# Patient Record
Sex: Male | Born: 1945 | Race: White | Hispanic: No | Marital: Married | Smoking: Former smoker
Health system: Southern US, Community
[De-identification: ages and names within clinical notes are randomized; demographics above are authoritative.]

## PROBLEM LIST (undated history)

## (undated) DIAGNOSIS — M51369 Other intervertebral disc degeneration, lumbar region without mention of lumbar back pain or lower extremity pain: Secondary | ICD-10-CM

## (undated) DIAGNOSIS — M4810 Ankylosing hyperostosis [Forestier], site unspecified: Secondary | ICD-10-CM

## (undated) DIAGNOSIS — I2510 Atherosclerotic heart disease of native coronary artery without angina pectoris: Secondary | ICD-10-CM

## (undated) DIAGNOSIS — E785 Hyperlipidemia, unspecified: Secondary | ICD-10-CM

## (undated) DIAGNOSIS — M5136 Other intervertebral disc degeneration, lumbar region: Secondary | ICD-10-CM

## (undated) DIAGNOSIS — I219 Acute myocardial infarction, unspecified: Secondary | ICD-10-CM

## (undated) HISTORY — DX: Other intervertebral disc degeneration, lumbar region: M51.36

## (undated) HISTORY — DX: Other intervertebral disc degeneration, lumbar region without mention of lumbar back pain or lower extremity pain: M51.369

## (undated) HISTORY — DX: Acute myocardial infarction, unspecified: I21.9

## (undated) HISTORY — DX: Atherosclerotic heart disease of native coronary artery without angina pectoris: I25.10

## (undated) HISTORY — DX: Hyperlipidemia, unspecified: E78.5

---

## 1992-10-23 DIAGNOSIS — I219 Acute myocardial infarction, unspecified: Secondary | ICD-10-CM

## 1992-10-23 HISTORY — DX: Acute myocardial infarction, unspecified: I21.9

## 1992-10-23 HISTORY — PX: HERNIA REPAIR: SHX51

## 2000-10-23 HISTORY — PX: CORONARY ANGIOPLASTY WITH STENT PLACEMENT: SHX49

## 2011-02-28 ENCOUNTER — Ambulatory Visit (INDEPENDENT_AMBULATORY_CARE_PROVIDER_SITE_OTHER): Payer: Private Health Insurance - Indemnity | Admitting: Family Medicine

## 2011-02-28 ENCOUNTER — Encounter: Payer: Self-pay | Admitting: Family Medicine

## 2011-02-28 VITALS — BP 126/69 | HR 61 | Ht 69.0 in | Wt 173.0 lb

## 2011-02-28 DIAGNOSIS — M545 Low back pain: Secondary | ICD-10-CM

## 2011-02-28 MED ORDER — CYCLOBENZAPRINE HCL 10 MG PO TABS: 10.0000 mg | ORAL_TABLET | Freq: Every evening | ORAL | Status: AC | PRN

## 2011-02-28 MED ORDER — METHYLPREDNISOLONE (PAK) 4 MG PO TABS: 4.0000 mg | ORAL_TABLET | Freq: Every day | ORAL | Status: DC

## 2011-02-28 NOTE — Assessment & Plan Note (Signed)
Acute on chronic LBP with hx of disc herniation. Will treat with medrol dose pack and flexeril.  Gentle stretching, heat, ice, relative rest. Call if not improved in 7 days.

## 2011-02-28 NOTE — Progress Notes (Signed)
Subjective:    Patient ID: Jason Alvarado, male    DOB: 04/06/1946, 65 y.o.   MRN: [id number]  HPI 65 yo WM presents for a recurrence of LBP x 3 days.  He had an MRI of the L spine in June 2011 after the last flare up of back pain and told he had a herniated disc.  He had been doing home PT.  He had not had pain in between.  He has been doing E stim, ice and ibuprofen and vicodin from recent dental work. He son is a physical therapist.  Has pain with sitting and getting up.  He has pain over the middle of the lower back w/o radiation.  No change in bowel or bladder habits.  No paresthesias or weakness.  Denies pain in his legs, tingling or numbness.  BP 126/69  Pulse 61  Ht 5\' 9"  (1.753 m)  Wt 173 lb (78.472 kg)  BMI 25.55 kg/m2  SpO2 98%  Past Medical History  Diagnosis Date  . DDD (degenerative disc disease), lumbar     MRI 03-2010 in [place]  . CAD (coronary artery disease) 1994, 2002    angioplasty 1994, stented 2002  . Hyperlipidemia   . Acute MI 1994    Past Surgical History  Procedure Date  . Hernia repair 1994    repeat 1995, 2001    Family History  Problem Relation Age of Onset  . Heart disease Father   . Cancer Sister     History   Social History  . Marital Status: Married    Spouse Name: N/A    Number of Children: N/A  . Years of Education: N/A   Occupational History  . Not on file.   Social History Main Topics  . Smoking status: Former Smoker    Types: Cigarettes    Quit date: 10/23/1992  . Smokeless tobacco: Not on file  . Alcohol Use: 0.6 oz/week    1 Glasses of wine per week  . Drug Use: No  . Sexually Active: Yes    Birth Control/ Protection: Surgical   Other Topics Concern  . Not on file   Social History Narrative  . No narrative on file    Not on File  Current outpatient prescriptions:aspirin 81 MG tablet, Take 81 mg by mouth daily.  , Disp: , Rfl: ;  clopidogrel (PLAVIX) 75 MG tablet, Take 75 mg by mouth daily.  , Disp: , Rfl: ;   cyclobenzaprine (FLEXERIL) 10 MG tablet, Take 1 tablet (10 mg total) by mouth at bedtime as needed for muscle spasms., Disp: 20 tablet, Rfl: 1;  HYDROcodone-acetaminophen (VICODIN) 5-500 MG per tablet, , Disp: , Rfl:  methylPREDNIsolone (MEDROL DOSPACK) 4 MG tablet, Take 1 tablet (4 mg total) by mouth daily. follow package directions, Disp: 21 tablet, Rfl: 0;  Multiple Vitamin (MULTIVITAMIN PO), Take by mouth.  , Disp: , Rfl: ;  rosuvastatin (CRESTOR) 20 MG tablet, Take 20 mg by mouth daily.  , Disp: , Rfl:        Review of Systems  Constitutional: Negative for fatigue and unexpected weight change.  Respiratory: Negative for shortness of breath.   Cardiovascular: Negative for chest pain, palpitations and leg swelling.  Gastrointestinal: Negative for diarrhea and constipation.  Genitourinary: Negative for decreased urine volume and difficulty urinating.  Musculoskeletal: Positive for myalgias and back pain. Negative for arthralgias and gait problem.  Psychiatric/Behavioral: Positive for sleep disturbance. Negative for dysphoric mood.       Objective:  Physical Exam  Constitutional: He appears well-developed and well-nourished. No distress.  Eyes: Conjunctivae are normal.  Neck: Neck supple.  Cardiovascular: Normal rate, normal heart sounds and intact distal pulses.   No murmur heard. Pulmonary/Chest: Effort normal and breath sounds normal. No respiratory distress. He has no wheezes.  Abdominal: Soft. Bowel sounds are normal. He exhibits no distension.  Musculoskeletal: He exhibits no edema.       Lumbar back: He exhibits decreased range of motion (limited flex/ ext, + straight leg raise bilat), tenderness (tender at R>L L4-S1) and spasm (paraspinal fullness R side from T10- L5).  Neurological: He displays normal reflexes. He exhibits normal muscle tone.  Skin: Skin is warm and dry.  Psychiatric: He has a normal mood and affect.          Assessment & Plan:

## 2011-02-28 NOTE — Patient Instructions (Signed)
For back pain:  Take Medrol Dose pack as directed.  Avoid ibuprofen while you are on this.  OK to use Flexeril at bedtime (muscle relaxer).  Gentle stretching and avoidance of lifting, twisting will help.  Call if pain has not improved in 7-10 days.  Return for f/u visit with fasting labs in 2 mos.

## 2011-03-21 ENCOUNTER — Encounter: Payer: Self-pay | Admitting: Family Medicine

## 2011-03-21 DIAGNOSIS — E785 Hyperlipidemia, unspecified: Secondary | ICD-10-CM | POA: Insufficient documentation

## 2011-03-21 DIAGNOSIS — I10 Essential (primary) hypertension: Secondary | ICD-10-CM | POA: Insufficient documentation

## 2011-03-21 DIAGNOSIS — K5730 Diverticulosis of large intestine without perforation or abscess without bleeding: Secondary | ICD-10-CM | POA: Insufficient documentation

## 2011-03-21 DIAGNOSIS — K5900 Constipation, unspecified: Secondary | ICD-10-CM | POA: Insufficient documentation

## 2011-03-21 DIAGNOSIS — K219 Gastro-esophageal reflux disease without esophagitis: Secondary | ICD-10-CM | POA: Insufficient documentation

## 2011-05-15 ENCOUNTER — Ambulatory Visit (INDEPENDENT_AMBULATORY_CARE_PROVIDER_SITE_OTHER): Payer: Private Health Insurance - Indemnity | Admitting: Family Medicine

## 2011-05-15 ENCOUNTER — Encounter: Payer: Self-pay | Admitting: Family Medicine

## 2011-05-15 DIAGNOSIS — Z130 Encounter for screening for diseases of the blood and blood-forming organs and certain disorders involving the immune mechanism: Secondary | ICD-10-CM

## 2011-05-15 DIAGNOSIS — M545 Low back pain: Secondary | ICD-10-CM

## 2011-05-15 DIAGNOSIS — Z1329 Encounter for screening for other suspected endocrine disorder: Secondary | ICD-10-CM

## 2011-05-15 DIAGNOSIS — Z125 Encounter for screening for malignant neoplasm of prostate: Secondary | ICD-10-CM

## 2011-05-15 DIAGNOSIS — E785 Hyperlipidemia, unspecified: Secondary | ICD-10-CM

## 2011-05-15 DIAGNOSIS — I2510 Atherosclerotic heart disease of native coronary artery without angina pectoris: Secondary | ICD-10-CM | POA: Insufficient documentation

## 2011-05-15 LAB — CBC WITH DIFFERENTIAL/PLATELET
Basophils Absolute: 0 10*3/uL (ref 0.0–0.1)
Basophils Relative: 1 % (ref 0–1)
Hemoglobin: 13.4 g/dL (ref 13.0–17.0)
MCHC: 33.3 g/dL (ref 30.0–36.0)
Neutro Abs: 3.3 10*3/uL (ref 1.7–7.7)
Neutrophils Relative %: 64 % (ref 43–77)
RDW: 14.2 % (ref 11.5–15.5)
WBC: 5.1 10*3/uL (ref 4.0–10.5)

## 2011-05-15 LAB — LIPID PANEL
Total CHOL/HDL Ratio: 3.5 Ratio
VLDL: 26 mg/dL (ref 0–40)

## 2011-05-15 MED ORDER — ROSUVASTATIN CALCIUM 20 MG PO TABS: 20.0000 mg | ORAL_TABLET | Freq: Every day | ORAL | Status: DC

## 2011-05-15 MED ORDER — CLOPIDOGREL BISULFATE 75 MG PO TABS: 75.0000 mg | ORAL_TABLET | Freq: Every day | ORAL | Status: DC

## 2011-05-15 NOTE — Assessment & Plan Note (Signed)
RFd Crestor.  Update fasting labs today.

## 2011-05-15 NOTE — Assessment & Plan Note (Signed)
Resolved.  Off meds.  Doing stretching at home.

## 2011-05-15 NOTE — Patient Instructions (Addendum)
Update fasting labs today. Will call you w/ results tomorrow.  Plavix sent to [facility].  Crestor sent to [facility]. Will set you up to follow with Dr Deborah Chalk (cardiology) downstairs.  Return for a PHYSICAL in 6 mos.

## 2011-05-15 NOTE — Progress Notes (Signed)
  Subjective:    Patient ID: Jason Alvarado, male    DOB: [birth year], 65 y.o.   MRN: [id number]  HPI  65  yo WM presents for f/u visit.  He is due for fasting labs.  His back pain is much improved from his last visit in May.  Denies CP or DOE.  [place] cardiac [place]  Last seen by a cardiologist about a year and a half ago, on Plavix daily.  Due for RFs.  On Crestor daily.    BP 106/59  Pulse 59  Ht 5\' 9"  (1.753 m)  Wt 171 lb (77.565 kg)  BMI 25.25 kg/m2  SpO2 98%   Review of Systems  Respiratory: Negative for shortness of breath.   Cardiovascular: Negative for chest pain, palpitations and leg swelling.  Musculoskeletal: Negative for back pain.       Objective:   Physical Exam  Constitutional: He appears well-developed and well-nourished.  HENT:  Mouth/Throat: Oropharynx is clear and moist.  Neck:       No audible carotid bruits  Cardiovascular: Normal rate, regular rhythm and normal heart sounds.   Pulmonary/Chest: Effort normal and breath sounds normal. No respiratory distress. He has no wheezes.  Musculoskeletal: He exhibits no edema.          Assessment & Plan:

## 2011-05-15 NOTE — Assessment & Plan Note (Signed)
Will RF his Plavix and statin today, update labs and set him up to est care with Dr Jens Som.

## 2011-05-16 LAB — COMPLETE METABOLIC PANEL WITH GFR
ALT: 11 U/L (ref 0–53)
AST: 16 U/L (ref 0–37)
Albumin: 4.3 g/dL (ref 3.5–5.2)
Alkaline Phosphatase: 42 U/L (ref 39–117)
Potassium: 3.9 mEq/L (ref 3.5–5.3)
Sodium: 137 mEq/L (ref 135–145)
Total Protein: 6.4 g/dL (ref 6.0–8.3)

## 2011-05-16 LAB — PSA: PSA: 1.79 ng/mL (ref <=4.00)

## 2011-05-18 ENCOUNTER — Telehealth: Payer: Self-pay | Admitting: Family Medicine

## 2011-05-18 NOTE — Telephone Encounter (Signed)
Pls let pt know that his blood counts, cholesterol and screen for prostate cancer all look great.   THE LAB HAS YET TO RUN HIS CHEMISTRY PANEL, SO RESULTS ARE STILL PENDING.

## 2011-05-18 NOTE — Telephone Encounter (Signed)
Advised pt's wife of results and made copy for pt to pick up tomr.

## 2011-05-21 ENCOUNTER — Telehealth: Payer: Self-pay | Admitting: Family Medicine

## 2011-05-21 NOTE — Telephone Encounter (Signed)
Chemistry panel finally came back and his fasting sugar, liver and kidney function look great.

## 2011-05-22 NOTE — Telephone Encounter (Signed)
LMOM informing Pt  

## 2011-10-31 ENCOUNTER — Encounter: Payer: Self-pay | Admitting: Family Medicine

## 2011-10-31 ENCOUNTER — Ambulatory Visit (INDEPENDENT_AMBULATORY_CARE_PROVIDER_SITE_OTHER): Payer: Private Health Insurance - Indemnity | Admitting: Family Medicine

## 2011-10-31 VITALS — BP 115/62 | HR 70 | Ht 69.0 in | Wt 178.0 lb

## 2011-10-31 DIAGNOSIS — M545 Low back pain, unspecified: Secondary | ICD-10-CM

## 2011-10-31 DIAGNOSIS — Z0000 Encounter for general adult medical examination without abnormal findings: Secondary | ICD-10-CM

## 2011-10-31 DIAGNOSIS — R21 Rash and other nonspecific skin eruption: Secondary | ICD-10-CM

## 2011-10-31 DIAGNOSIS — I2510 Atherosclerotic heart disease of native coronary artery without angina pectoris: Secondary | ICD-10-CM

## 2011-10-31 DIAGNOSIS — Z23 Encounter for immunization: Secondary | ICD-10-CM

## 2011-10-31 LAB — PSA: PSA: 2.08 ng/mL (ref <=4.00)

## 2011-10-31 LAB — LIPID PANEL
HDL: 43 mg/dL (ref >39)
Total CHOL/HDL Ratio: 4.5 Ratio
Triglycerides: 227 mg/dL — ABNORMAL HIGH (ref <150)

## 2011-10-31 MED ORDER — HYOSCYAMINE SULFATE 0.125 MG SL SUBL: 0.1250 mg | SUBLINGUAL_TABLET | Freq: Four times a day (QID) | SUBLINGUAL | Status: AC | PRN

## 2011-10-31 NOTE — Patient Instructions (Signed)
Start a regular exercise program and make sure you are eating a healthy diet Try to eat 4 servings of dairy a day or take a calcium supplement (500mg  twice a day). Your vaccines are up to date.  Think about the singles vaccine.

## 2011-10-31 NOTE — Progress Notes (Signed)
Subjective:    Patient ID: Jason Alvarado, male    DOB: [birth year], 66 y.o.   MRN: [id number]  HPI He is here for physical today. He is not established with a cardiologist in the area since moving here from [place]. He does have a history of coronary artery disease with a stent times one. He does not take a beta blocker but he does take an aspirin and Plavix. He would like to be established more locally.  Chronic low back pain. His history of degenerative disc disease in his lumbar spine. He did have an MRI in 2011 in [place] that showed DISH.  He did complete physical therapy initially and says it helped him greatly. He has never seen an orthopedist for this. He is currently not taking any pain medications. It is worse with prolonged sitting. 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 He is also now experienced and upper back pain as well.  Rash on his right shin that has been there for couple months. Overall he says it does look better. Initially was very red and irritated and now it looks more dusky and purple. It is scaling. He has been putting a cream on it that his wife gave him. He is not exactly sure what is in it. He denies any itching, irritation, pain and drainage. He denies any initial trauma. No prior history of skin cancer.  Review of Systems Comprehensive ROS is o/w neg. No CP or SOB.   BP 115/62  Pulse 70  Ht 5\' 9"  (1.753 m)  Wt 178 lb (80.74 kg)  BMI 26.29 kg/m2    No Known Allergies  Past Medical History  Diagnosis Date  . DDD (degenerative disc disease), lumbar     MRI 03-2010 in [place]  . CAD (coronary artery disease) 1994, 2002    angioplasty 1994, stented x 1 2002  . Hyperlipidemia   . Acute MI 1994    Past Surgical History  Procedure Date  . Hernia repair 1994    repeat 1995, 2001 groin  . Coronary angioplasty with stent placement 2002    History   Social History  . Marital Status: Married    Spouse Name:  Angelique Blonder     Number of Children: 3   . Years of Education: N/A   Occupational History  . Engineer.     Social History Main Topics  . Smoking status: Former Smoker    Types: Cigarettes    Quit date: 10/23/1992  . Smokeless tobacco: Not on file  . Alcohol Use: 0.6 oz/week    1 Glasses of wine per week  . Drug Use: No  . Sexually Active: Yes    Birth Control/ Protection: Surgical   Other Topics Concern  . Not on file   Social History Narrative   2-3 cups per day coffee.  No regular exercise.     Family History  Problem Relation Age of Onset  . Heart disease Father   . Breast cancer Sister   . Rheum arthritis Mother     Current outpatient prescriptions:aspirin 81 MG tablet, Take 81 mg by mouth daily.  , Disp: , Rfl: ;  clopidogrel (PLAVIX) 75 MG tablet, Take 1 tablet (75 mg total) by mouth daily., Disp: 90 tablet, Rfl: 3;  Multiple Vitamin (MULTIVITAMIN PO), Take by mouth.  , Disp: , Rfl: ;  rosuvastatin (CRESTOR) 20 MG tablet, Take 1 tablet (20 mg total) by  mouth daily., Disp: 90 tablet, Rfl: 3 cyclobenzaprine (FLEXERIL) 10 MG tablet, Take 1 tablet (10 mg total) by mouth at bedtime as needed for muscle spasms., Disp: 20 tablet, Rfl: 1      Objective:   Physical Exam  Constitutional: He is oriented to person, place, and time. He appears well-developed and well-nourished.  HENT:  Head: Normocephalic and atraumatic.  Right Ear: External ear normal.  Left Ear: External ear normal.  Nose: Nose normal.  Mouth/Throat: Oropharynx is clear and moist.  Eyes: Conjunctivae and EOM are normal. Pupils are equal, round, and reactive to light.  Neck: Normal range of motion. Neck supple. No thyromegaly present.  Cardiovascular: Normal rate, regular rhythm, normal heart sounds and intact distal pulses.   Pulmonary/Chest: Effort normal and breath sounds normal.  Abdominal: Soft. Bowel sounds are normal. He exhibits no distension and no mass. There is no tenderness. There is no rebound and  no guarding.  Genitourinary: Rectum normal. Guaiac negative stool.       He does have a 2+ enlarged prostate, no bogginess, no nodules. He denies any urinary symptoms.  Musculoskeletal: Normal range of motion.       Normal flexion and extension of the spine. He is nontender over the lumbar or upper thoracic spine. He has very tight muscles around the upper thoracic spine. He also has a lot of spasm around the left paraspinous muscles. Negative straight-leg raise bilaterally. Hip, knee, ankle strength is 5 over 5 bilaterally.  Lymphadenopathy:    He has no cervical adenopathy.  Neurological: He is alert and oriented to person, place, and time. He has normal reflexes.  Skin: Skin is warm and dry.       On his right shin he has an approximately 1 x 3 cm oval-shaped purple macular papular lesion. He has some thick scaling across the top. I did scrape the lesion to send for KOH.  Psychiatric: He has a normal mood and affect. His behavior is normal. Judgment and thought content normal.          Assessment & Plan:  CPE - Start a regular exercise program and make sure you are eating a healthy diet Try to eat 4 servings of dairy a day or take a calcium supplement (500mg  twice a day). Your vaccines are up to date.   Pneumonia vaccine updated today. We did discuss shingles vaccines but unfortunately it is on back order right now. I asked him to check with his insurance company to see if it is covered.  Chronic low back and now upper back pain-based on his exam and his history I think he would greatly benefit from physical therapy. If he is not improving at that time then consider further evaluation by orthopedics.  Coronary artery disease-he is on a beta blocker but he is on aspirin and Plavix. His blood pressure looks fantastic today. There are some newer study showed there may not be long term benefit past one year the patient's with coronary artery disease/MI. I would like to get him established  here locally and we'll refer him to Dr. Olga Millers in a building.  Rash on the right shin-the area was scraped with the slides and will be sent for KOH for evaluation of fungal elements.

## 2011-11-01 LAB — COMPLETE METABOLIC PANEL WITH GFR
Alkaline Phosphatase: 52 U/L (ref 39–117)
GFR, Est Non African American: 85 mL/min
Glucose, Bld: 100 mg/dL — ABNORMAL HIGH (ref 70–99)
Sodium: 140 mEq/L (ref 135–145)
Total Bilirubin: 0.6 mg/dL (ref 0.3–1.2)
Total Protein: 6.7 g/dL (ref 6.0–8.3)

## 2011-11-08 ENCOUNTER — Ambulatory Visit (INDEPENDENT_AMBULATORY_CARE_PROVIDER_SITE_OTHER): Payer: Private Health Insurance - Indemnity | Admitting: Cardiology

## 2011-11-08 ENCOUNTER — Encounter: Payer: Self-pay | Admitting: Family Medicine

## 2011-11-08 ENCOUNTER — Ambulatory Visit: Payer: Private Health Insurance - Indemnity | Attending: Family Medicine | Admitting: Physical Therapy

## 2011-11-08 ENCOUNTER — Encounter: Payer: Self-pay | Admitting: Cardiology

## 2011-11-08 VITALS — BP 131/77 | HR 61 | Ht 69.0 in | Wt 179.0 lb

## 2011-11-08 DIAGNOSIS — I2510 Atherosclerotic heart disease of native coronary artery without angina pectoris: Secondary | ICD-10-CM

## 2011-11-08 DIAGNOSIS — M5136 Other intervertebral disc degeneration, lumbar region: Secondary | ICD-10-CM | POA: Insufficient documentation

## 2011-11-08 DIAGNOSIS — M2569 Stiffness of other specified joint, not elsewhere classified: Secondary | ICD-10-CM | POA: Insufficient documentation

## 2011-11-08 DIAGNOSIS — M545 Low back pain, unspecified: Secondary | ICD-10-CM | POA: Insufficient documentation

## 2011-11-08 DIAGNOSIS — IMO0001 Reserved for inherently not codable concepts without codable children: Secondary | ICD-10-CM | POA: Insufficient documentation

## 2011-11-08 DIAGNOSIS — E785 Hyperlipidemia, unspecified: Secondary | ICD-10-CM

## 2011-11-08 DIAGNOSIS — R079 Chest pain, unspecified: Secondary | ICD-10-CM | POA: Insufficient documentation

## 2011-11-08 MED ORDER — ROSUVASTATIN CALCIUM 40 MG PO TABS: 40.0000 mg | ORAL_TABLET | Freq: Every day | ORAL | Status: DC

## 2011-11-08 NOTE — Patient Instructions (Signed)
Your physician wants you to follow-up in: 6 months You will receive a reminder letter in the mail two months in advance. If you don't receive a letter, please call our office to schedule the follow-up appointment.   STOP PLAVIX  Your physician has requested that you have en exercise stress myoview. For further information please visit [email or web address] Please follow instruction sheet, as given.   INCREASE CRESTOR TO 40 MG ONCE DAILY  Your physician recommends that you return for lab work in: 6 WEEKS

## 2011-11-08 NOTE — Assessment & Plan Note (Signed)
Symptoms atypical. Schedule Myoview for risk stratification. 

## 2011-11-08 NOTE — Assessment & Plan Note (Signed)
Increase Crestor to 40 mg daily as recent LDL greater than 100. Check lipids and liver in 6 weeks.

## 2011-11-08 NOTE — Assessment & Plan Note (Signed)
Continue aspirin and statin. Discontinue Plavix. 

## 2011-11-08 NOTE — Progress Notes (Addendum)
HPI: 66 year old male for evaluation of coronary disease and chest pain. Patient suffered previous myocardial infarction and had PTCA in [place]. His last stent was placed in 2002 (RCA). He was then followed in [place] but now presents to establish here. Approximately one week ago the patient was working in his yard. 2 days later he developed a chest tightness for approximately one hour. The pain was not pleuritic, positional or related to food. No radiation and no associated symptoms. Unlike his previous infarct pain. He feels as though it may have been musculoskeletal. He otherwise denies dyspnea on exertion, orthopnea, PND, pedal edema or exertional chest pain.  Current Outpatient Prescriptions  Medication Sig Dispense Refill  . aspirin 81 MG tablet Take 81 mg by mouth daily.        . clopidogrel (PLAVIX) 75 MG tablet Take 1 tablet (75 mg total) by mouth daily.  90 tablet  3  . cyclobenzaprine (FLEXERIL) 10 MG tablet Take 1 tablet (10 mg total) by mouth at bedtime as needed for muscle spasms.  20 tablet  1  . hyoscyamine (LEVSIN SL) 0.125 MG SL tablet Place 1 tablet (0.125 mg total) under the tongue every 6 (six) hours as needed for cramping.  30 tablet  0  . Multiple Vitamin (MULTIVITAMIN PO) Take by mouth.        . rosuvastatin (CRESTOR) 20 MG tablet Take 1 tablet (20 mg total) by mouth daily.  90 tablet  3    No Known Allergies  Past Medical History  Diagnosis Date  . DDD (degenerative disc disease), lumbar     MRI 03-2010 in [place]  . CAD (coronary artery disease) 1994, 2002    angioplasty 1994, stented x 1 2002  . Hyperlipidemia   . Acute MI 1994    Past Surgical History  Procedure Date  . Hernia repair 1994    repeat 1995, 2001 groin  . Coronary angioplasty with stent placement 2002    History   Social History  . Marital Status: Married    Spouse Name: Angelique Blonder     Number of Children: 3   . Years of Education: N/A   Occupational History  . Engineer.     Social  History Main Topics  . Smoking status: Former Smoker    Types: Cigarettes    Quit date: 10/23/1992  . Smokeless tobacco: Not on file  . Alcohol Use: 0.6 oz/week    1 Glasses of wine per week     Occasionally  . Drug Use: No  . Sexually Active: Yes    Birth Control/ Protection: Surgical   Other Topics Concern  . Not on file   Social History Narrative   2-3 cups per day coffee.  No regular exercise.     Family History  Problem Relation Age of Onset  . Heart disease Father     MI at age 81  . Breast cancer Sister   . Rheum arthritis Mother     ROS: no fevers or chills, productive cough, hemoptysis, dysphasia, odynophagia, melena, hematochezia, dysuria, hematuria, rash, seizure activity, orthopnea, PND, pedal edema, claudication. Remaining systems are negative.  Physical Exam:  Blood pressure 131/77, pulse 61, height 5\' 9"  (1.753 m), weight 179 lb (81.194 kg).  General:  Well developed/well nourished in NAD Skin warm/dry Patient not depressed No peripheral clubbing Back-normal HEENT-normal/normal eyelids Neck supple/normal carotid upstroke bilaterally; no bruits; no JVD; no thyromegaly chest - CTA/ normal expansion CV - RRR/normal S1 and S2; no murmurs,  rubs or gallops;  PMI nondisplaced Abdomen -NT/ND, no HSM, no mass, + bowel sounds, no bruit 2+ femoral pulses, no bruits Ext-no edema, chords, 2+ DP Neuro-grossly nonfocal  ECG Sinus rhythm with no ST changes; CRO prior IMI

## 2011-11-14 ENCOUNTER — Ambulatory Visit (HOSPITAL_COMMUNITY): Payer: Private Health Insurance - Indemnity | Attending: Cardiology | Admitting: Radiology

## 2011-11-14 VITALS — BP 121/76 | Ht 69.0 in | Wt 176.0 lb

## 2011-11-14 DIAGNOSIS — R079 Chest pain, unspecified: Secondary | ICD-10-CM

## 2011-11-14 DIAGNOSIS — I2510 Atherosclerotic heart disease of native coronary artery without angina pectoris: Secondary | ICD-10-CM

## 2011-11-14 DIAGNOSIS — E785 Hyperlipidemia, unspecified: Secondary | ICD-10-CM

## 2011-11-14 DIAGNOSIS — Z8249 Family history of ischemic heart disease and other diseases of the circulatory system: Secondary | ICD-10-CM | POA: Insufficient documentation

## 2011-11-14 DIAGNOSIS — Z9861 Coronary angioplasty status: Secondary | ICD-10-CM | POA: Insufficient documentation

## 2011-11-14 DIAGNOSIS — R0789 Other chest pain: Secondary | ICD-10-CM | POA: Insufficient documentation

## 2011-11-14 DIAGNOSIS — Z87891 Personal history of nicotine dependence: Secondary | ICD-10-CM | POA: Insufficient documentation

## 2011-11-14 DIAGNOSIS — I10 Essential (primary) hypertension: Secondary | ICD-10-CM | POA: Insufficient documentation

## 2011-11-14 MED ORDER — TECHNETIUM TC 99M TETROFOSMIN IV KIT
33.0000 | PACK | Freq: Once | INTRAVENOUS | Status: AC | PRN
  Administered 2011-11-14: 33 via INTRAVENOUS

## 2011-11-14 MED ORDER — TECHNETIUM TC 99M TETROFOSMIN IV KIT
11.0000 | PACK | Freq: Once | INTRAVENOUS | Status: AC | PRN
  Administered 2011-11-14: 11 via INTRAVENOUS

## 2011-11-14 NOTE — Progress Notes (Signed)
[facility] SITE 3 NUCLEAR MED [address] [place] [address] [phone]  Cardiology Nuclear Med Study  Jason Alvarado is a 66 y.o. male [id number] [birth year]   Nuclear Med Background Indication for Stress Test:  Evaluation for Ischemia and PTCA/Stent Patency History:  Prior Cardiac Hx. Performed in [place]; '94 MI>PTCA; '02 Stent-RCA; [facility]:(no report)  Cardiac Risk Factors: Family History - CAD, History of Smoking, Hypertension and Lipids  Symptoms:  Chest Pain/Tightness with Exertion (last episode of chest discomfort about a week ago)   Nuclear Pre-Procedure Caffeine/Decaff Intake:  None NPO After: 7:00pm   Lungs:  Clear. IV 0.9% NS with Angio Cath:  20g  IV Site: R Antecubital x 1, tolerated well IV Started by:  Irean Hong, RN  Chest Size (in):  40 Cup Size: n/a  Height: 5\' 9"  (1.753 m)  Weight:  176 lb (79.833 kg)  BMI:  Body mass index is 25.99 kg/(m^2). Tech Comments:  n/a    Nuclear Med Study 1 or 2 day study: 1 day  Stress Test Type:  Stress  Reading MD: Willa Rough, MD  Order Authorizing Provider:  Olga Millers, MD  Resting Radionuclide: Technetium 59m Tetrofosmin  Resting Radionuclide Dose: 11.0 mCi   Stress Radionuclide:  Technetium 59m Tetrofosmin  Stress Radionuclide Dose: 33.0 mCi           Stress Protocol Rest HR: 62 Stress HR: 157  Rest BP: Sitting 121/76  Standing 140/73 Stress BP: 184/66  Exercise Time (min): 8:31 METS: 10.4   Predicted Max HR: 154 bpm % Max HR: 101.95 bpm Rate Pressure Product: 21308   Dose of Adenosine (mg):  n/a Dose of Lexiscan: n/a mg  Dose of Atropine (mg): n/a Dose of Dobutamine: n/a mcg/kg/min (at max HR)  Stress Test Technologist: Smiley Houseman, CMA-N  Nuclear Technologist:  Domenic Polite, CNMT     Rest Procedure:  Myocardial perfusion imaging was performed at rest 45 minutes following the intravenous administration of Technetium 44m Tetrofosmin.  Rest ECG: Nonspecific T-wave changes.  Stress  Procedure:  The patient exercised for 8:31 on the treadmill utilizing the Bruce protocol.  The patient stopped due to fatigue and denied any chest pain.  There were no diagnostic ST-T wave changes, isolated PVC.  Technetium 2m Tetrofosmin was injected at peak exercise and myocardial perfusion imaging was performed after a brief delay.  Stress ECG: No significant change from baseline ECG  QPS Raw Data Images:  Patient motion noted; appropriate software correction applied. Stress Images:  Moderate decresed activity at the base of the inferolateral wall. Rest Images:  Similar to stress. Subtraction (SDS):  Slight reversal at the base of the inferolateral wall. Transient Ischemic Dilatation (Normal <1.22):  1.04 Lung/Heart Ratio (Normal <0.45):  0.30  Quantitative Gated Spect Images QGS EDV:  107 ml QGS ESV:  50 ml QGS cine images:  Hypokinesis of the base of the inferior wall. QGS EF: 53%  Impression Exercise Capacity:  Good exercise capacity. BP Response:  Normal blood pressure response. Clinical Symptoms:  No chest pain. ECG Impression:  No significant ST segment change suggestive of ischemia. Comparison with Prior Nuclear Study: No images to compare  Overall Impression:  There is a small/moderate scar at the base of the inferior/inferolateral wall. There is mild peri-infarct ischemia in that area.  Willa Rough, MD

## 2011-11-15 ENCOUNTER — Ambulatory Visit: Payer: Private Health Insurance - Indemnity | Admitting: Physical Therapy

## 2011-12-05 ENCOUNTER — Ambulatory Visit: Payer: Private Health Insurance - Indemnity | Attending: Family Medicine | Admitting: Physical Therapy

## 2011-12-05 DIAGNOSIS — M2569 Stiffness of other specified joint, not elsewhere classified: Secondary | ICD-10-CM | POA: Insufficient documentation

## 2011-12-05 DIAGNOSIS — M545 Low back pain, unspecified: Secondary | ICD-10-CM | POA: Insufficient documentation

## 2011-12-05 DIAGNOSIS — IMO0001 Reserved for inherently not codable concepts without codable children: Secondary | ICD-10-CM | POA: Insufficient documentation

## 2011-12-07 ENCOUNTER — Ambulatory Visit: Payer: Private Health Insurance - Indemnity | Admitting: Physical Therapy

## 2011-12-12 ENCOUNTER — Ambulatory Visit: Payer: Private Health Insurance - Indemnity | Admitting: *Deleted

## 2011-12-14 ENCOUNTER — Ambulatory Visit: Payer: Private Health Insurance - Indemnity | Admitting: Physical Therapy

## 2011-12-19 ENCOUNTER — Ambulatory Visit: Payer: Private Health Insurance - Indemnity | Admitting: *Deleted

## 2011-12-21 ENCOUNTER — Ambulatory Visit: Payer: Private Health Insurance - Indemnity | Admitting: *Deleted

## 2011-12-26 ENCOUNTER — Ambulatory Visit: Payer: Private Health Insurance - Indemnity | Admitting: Physical Therapy

## 2011-12-26 ENCOUNTER — Ambulatory Visit: Payer: Private Health Insurance - Indemnity | Attending: Family Medicine | Admitting: Physical Therapy

## 2011-12-26 DIAGNOSIS — M545 Low back pain, unspecified: Secondary | ICD-10-CM | POA: Insufficient documentation

## 2011-12-26 DIAGNOSIS — M2569 Stiffness of other specified joint, not elsewhere classified: Secondary | ICD-10-CM | POA: Insufficient documentation

## 2011-12-26 DIAGNOSIS — IMO0001 Reserved for inherently not codable concepts without codable children: Secondary | ICD-10-CM | POA: Insufficient documentation

## 2011-12-28 ENCOUNTER — Ambulatory Visit: Payer: Private Health Insurance - Indemnity | Admitting: Physical Therapy

## 2012-01-02 ENCOUNTER — Ambulatory Visit: Payer: Private Health Insurance - Indemnity | Admitting: Physical Therapy

## 2012-10-03 ENCOUNTER — Emergency Department (INDEPENDENT_AMBULATORY_CARE_PROVIDER_SITE_OTHER)
Admission: EM | Admit: 2012-10-03 | Discharge: 2012-10-03 | Disposition: A | Discharge status: Home / Self Care | Payer: Private Health Insurance - Indemnity | Source: Home / Self Care | Attending: Family Medicine | Admitting: Family Medicine

## 2012-10-03 ENCOUNTER — Encounter: Payer: Self-pay | Admitting: *Deleted

## 2012-10-03 ENCOUNTER — Emergency Department (INDEPENDENT_AMBULATORY_CARE_PROVIDER_SITE_OTHER): Payer: Private Health Insurance - Indemnity

## 2012-10-03 DIAGNOSIS — M25569 Pain in unspecified knee: Secondary | ICD-10-CM

## 2012-10-03 DIAGNOSIS — M1710 Unilateral primary osteoarthritis, unspecified knee: Secondary | ICD-10-CM

## 2012-10-03 NOTE — ED Notes (Signed)
Pt c/o LT knee pain x 1wk. Pt reports having injections in that knee in 2008. He has taken IBF.

## 2012-10-03 NOTE — ED Provider Notes (Addendum)
History     CSN: [id number]  Arrival date & time 10/03/12  1616   First MD Initiated Contact with Patient 10/03/12 1619      Chief Complaint  Patient presents with  . Knee Pain   HPI  L knee pain x 1 week.  Baseline hx/o knee OA.  Has had hyaluronic acid injections in the past.  This was several years ago.  Has had flare of knee pain. No known injury.  Mild knee swelling, though improved.  Pain worst in medial compartment of knee.  Pain constant.  Worse with bending.   Past Medical History  Diagnosis Date  . DDD (degenerative disc disease), lumbar     MRI 03-2010 in [place]  . CAD (coronary artery disease) 1994, 2002    angioplasty 1994, stented x 1 2002  . Hyperlipidemia   . Acute MI 1994    Past Surgical History  Procedure Date  . Hernia repair 1994    repeat 1995, 2001 groin  . Coronary angioplasty with stent placement 2002    Family History  Problem Relation Age of Onset  . Heart disease Father     MI at age 79  . Breast cancer Sister   . Rheum arthritis Mother     History  Substance Use Topics  . Smoking status: Former Smoker    Types: Cigarettes    Quit date: 10/23/1992  . Smokeless tobacco: Not on file  . Alcohol Use: 0.6 oz/week    1 Glasses of wine per week     Comment: Occasionally      Review of Systems  All other systems reviewed and are negative.    Allergies  Review of patient's allergies indicates no known allergies.  Home Medications   Current Outpatient Rx  Name  Route  Sig  Dispense  Refill  . ASPIRIN 81 MG PO TABS   Oral   Take 81 mg by mouth daily.           . MULTIVITAMIN PO   Oral   Take by mouth.           . ROSUVASTATIN CALCIUM 40 MG PO TABS   Oral   Take 1 tablet (40 mg total) by mouth daily.   90 tablet   3     BP 124/70  Pulse 71  Temp 98.1 F (36.7 C) (Oral)  Resp 16  Ht 5\' 9"  (1.753 m)  Wt 178 lb (80.74 kg)  BMI 26.29 kg/m2  SpO2 98%  Physical Exam  Constitutional: He appears  well-developed and well-nourished.  HENT:  Head: Normocephalic and atraumatic.  Eyes: Conjunctivae normal are normal. Pupils are equal, round, and reactive to light.  Neck: Normal range of motion. Neck supple.  Cardiovascular: Normal rate and regular rhythm.   Pulmonary/Chest: Effort normal and breath sounds normal.  Abdominal: Soft. Bowel sounds are normal.  Musculoskeletal:       + TTP along L medial knee compartment  Full ROM  McMurray's mildly positive medially.  Mild pain with resisted knee extension.    Neurological: He is alert.  Skin: Skin is warm.    ED Course  L knee joint injection  Performed by: Doree Albee Authorized by: Doree Albee Consent: Verbal consent obtained. Risks and benefits: risks, benefits and alternatives were discussed Preparation: Patient was prepped and draped in the usual sterile fashion. Local anesthesia used: yes Anesthesia: local infiltration (topical ehtyl choride ) Patient tolerance: Patient tolerated the procedure well with no immediate complications.  Comments: 1 cc of triamcinolone 40,g/mL and 4 ccs of 2% lidocaine without epinephrine injected into L knee via medial.  Area marked and prepped in sterile fashion.   Injection of joint Performed by: Doree Albee Authorized by: Doree Albee Consent: Verbal consent obtained. Risks and benefits: risks, benefits and alternatives were discussed Preparation: Patient was prepped and draped in the usual sterile fashion. Local anesthesia used: yes Anesthesia method: topical ethyl chloride  Patient tolerance: Patient tolerated the procedure well with no immediate complications. Comments: 1 cc of triamcinolone 40mg /mL and 4 ccs of 2% lidocaine without epinephrine injected into L knee via medial.  Area marked and prepped in sterile fashion.    (including critical care time)  Labs Reviewed - No data to display Dg Knee Complete 4 Views Left  10/03/2012  *RADIOLOGY REPORT*  Clinical Data:  Chronic left knee pain.  LEFT KNEE - COMPLETE 4+ VIEW  Comparison: None.  Findings: Degenerative changes within the left knee.  Spurring along the tibial spines and in the patellofemoral joint. No acute bony abnormality.  Specifically, no fracture, subluxation, or dislocation.  Soft tissues are intact.  No joint effusion.  IMPRESSION: Mild degenerative changes. No acute bony abnormality.   Original Report Authenticated By: Charlett Nose, M.D.      1. Knee pain   2. OA (osteoarthritis) of knee       MDM  Local steroid injection to L knee.  Discussed general care and sports medicine follow up  Otherwise follow up as needed.     The patient and/or caregiver has been counseled thoroughly with regard to treatment plan and/or medications prescribed including dosage, schedule, interactions, rationale for use, and possible side effects and they verbalize understanding. Diagnoses and expected course of recovery discussed and will return if not improved as expected or if the condition worsens. Patient and/or caregiver verbalized understanding.             Doree Albee, MD 10/03/12 Flossie Buffy  Doree Albee, MD 10/03/12 1813  Doree Albee, MD 10/03/12 2052

## 2012-12-24 ENCOUNTER — Ambulatory Visit (INDEPENDENT_AMBULATORY_CARE_PROVIDER_SITE_OTHER): Payer: Private Health Insurance - Indemnity

## 2012-12-24 ENCOUNTER — Other Ambulatory Visit: Payer: Self-pay | Admitting: Sports Medicine

## 2012-12-24 ENCOUNTER — Ambulatory Visit (INDEPENDENT_AMBULATORY_CARE_PROVIDER_SITE_OTHER): Payer: Private Health Insurance - Indemnity | Admitting: Sports Medicine

## 2012-12-24 DIAGNOSIS — M898X9 Other specified disorders of bone, unspecified site: Secondary | ICD-10-CM

## 2012-12-24 DIAGNOSIS — M170 Bilateral primary osteoarthritis of knee: Secondary | ICD-10-CM

## 2012-12-24 DIAGNOSIS — M4810 Ankylosing hyperostosis [Forestier], site unspecified: Secondary | ICD-10-CM

## 2012-12-24 DIAGNOSIS — IMO0002 Reserved for concepts with insufficient information to code with codable children: Secondary | ICD-10-CM

## 2012-12-24 DIAGNOSIS — R937 Abnormal findings on diagnostic imaging of other parts of musculoskeletal system: Secondary | ICD-10-CM

## 2012-12-24 DIAGNOSIS — M25569 Pain in unspecified knee: Secondary | ICD-10-CM

## 2012-12-24 DIAGNOSIS — M1710 Unilateral primary osteoarthritis, unspecified knee: Secondary | ICD-10-CM

## 2012-12-24 NOTE — Assessment & Plan Note (Signed)
Injected approximately 3 months ago. Good benefit. He did have a series of viscous supplementation approximately 5 years ago, these lasted almost as long. He would like to repeat viscous supplementation. We will go ahead and work on approval, and he will come back to see [place] for this. We will start on both knees.

## 2012-12-24 NOTE — Assessment & Plan Note (Addendum)
With very little pain right now. He will continue home exercises, some of his symptoms are discogenic. If pain recurs he will come back to see me and we can continue treatment.

## 2012-12-24 NOTE — Progress Notes (Signed)
  Subjective:    I'm seeing this patient as a consultation for:  Dr. Alvester Morin  CC: Bilateral knee pain  HPI: Bilateral knee pain: This is a very pleasant 67 year old male who was seen in the urgent care Center for knee pain. He has known osteoarthritis and was given an intra-articular injection in December of 2013. This lasted for several weeks, however the pain did recur. He will call several years back, in 2008 having a series of Visco supplementation. This provided him with 5-6 years of benefit. He is wondering if this can be repeated. Pain is localized at the joint lines, worse with weightbearing, worse when going downstairs, no mechanical symptoms, no swelling. His son is a PhD physical therapist and has given him some exercises which he has not yet started. He desires not to use oral medications.  Past medical history, Surgical history, Family history not pertinant except as noted below, Social history, Allergies, and medications have been entered into the medical record, reviewed, and no changes needed.   Review of Systems: No headache, visual changes, nausea, vomiting, diarrhea, constipation, dizziness, abdominal pain, skin rash, fevers, chills, night sweats, weight loss, swollen lymph nodes, body aches, joint swelling, muscle aches, chest pain, shortness of breath, mood changes, visual or auditory hallucinations.   Objective:   General: Well Developed, well nourished, and in no acute distress.  Neuro/Psych: Alert and oriented x3, extra-ocular muscles intact, able to move all 4 extremities, sensation grossly intact. Skin: Warm and dry, no rashes noted.  Respiratory: Not using accessory muscles, speaking in full sentences, trachea midline.  Cardiovascular: Pulses palpable, no extremity edema. Abdomen: Does not appear distended. Bilateral Knee: Normal to inspection with no erythema or effusion or obvious bony abnormalities. Tender to palpation at medial joint lines. ROM full in flexion  and extension and lower leg rotation. Ligaments with solid consistent endpoints including ACL, PCL, LCL, MCL. Negative Mcmurray's, Apley's, and Thessalonian tests. Non painful patellar compression. Patellar glide without crepitus. Patellar and quadriceps tendons unremarkable. Hamstring and quadriceps strength is normal.   X-rays reviewed and showed generalized tibiotalar and patellofemoral degenerative changes. The left knee is significantly worse than the right with predominant medial tibiofemoral joint space narrowing.  Impression and Recommendations:   This case required medical decision making of moderate complexity.

## 2012-12-24 NOTE — Patient Instructions (Addendum)
Come back to see [place] we work on approval for Supartz injections. VIscosupplementation.

## 2013-01-07 ENCOUNTER — Other Ambulatory Visit: Payer: Self-pay | Admitting: Sports Medicine

## 2013-01-07 MED ORDER — SODIUM HYALURONATE (VISCOSUP) 20 MG/2ML IX SOLN
2.0000 mL | INTRA_ARTICULAR | Status: DC
Start: 2013-01-07 — End: 2013-01-08

## 2013-01-08 ENCOUNTER — Telehealth: Payer: Self-pay | Admitting: *Deleted

## 2013-01-08 MED ORDER — SODIUM HYALURONATE (VISCOSUP) 20 MG/2ML IX SOLN: 2.0000 mL | INTRA_ARTICULAR | Status: DC

## 2013-01-08 NOTE — Telephone Encounter (Signed)
Left message on pt's vm to go to pharm to fill rx and make an appt here for [place] to administer meds. (see Dr. Melvia Heaps previous note)

## 2013-01-08 NOTE — Telephone Encounter (Signed)
Pharmacy called and states euflexxa has to be sent through [facility] mail order per pt's insurance. Printed rx and faxed to [facility]

## 2013-01-14 ENCOUNTER — Ambulatory Visit (INDEPENDENT_AMBULATORY_CARE_PROVIDER_SITE_OTHER): Payer: Private Health Insurance - Indemnity | Admitting: Sports Medicine

## 2013-01-14 ENCOUNTER — Encounter: Payer: Self-pay | Admitting: Sports Medicine

## 2013-01-14 VITALS — BP 116/67 | HR 72 | Wt 177.0 lb

## 2013-01-14 DIAGNOSIS — M1710 Unilateral primary osteoarthritis, unspecified knee: Secondary | ICD-10-CM

## 2013-01-14 DIAGNOSIS — M170 Bilateral primary osteoarthritis of knee: Secondary | ICD-10-CM

## 2013-01-14 DIAGNOSIS — IMO0002 Reserved for concepts with insufficient information to code with codable children: Secondary | ICD-10-CM

## 2013-01-14 MED ORDER — SODIUM HYALURONATE (VISCOSUP) 20 MG/2ML IX SOLN: 2.0000 mL | INTRA_ARTICULAR | Status: DC

## 2013-01-14 NOTE — Progress Notes (Signed)
Procedure: Real-time Ultrasound Guided Injection of left knee. Device: GE Logiq E  Ultrasound guided injection is preferred based studies that show increased duration, increased effect, greater accuracy, decreased procedural pain, increased response rate, and decreased cost with ultrasound guided versus blind injection.  Verbal informed consent obtained.  Time-out conducted.  Noted no overlying erythema, induration, or other signs of local infection.  Skin prepped in a sterile fashion.  Local anesthesia: Topical Ethyl chloride.  With sterile technique and under real time ultrasound guidance:  2 cc Kenalog 40, 4 cc lidocaine injected into the knee, syringe which and 20 mg/2 mL of Euflexxa (sodium hyaluronate) in a prefilled syringe was injected easily into the knee through a 22-gauge needle. Completed without difficulty  Pain immediately resolved suggesting accurate placement of the medication.  Advised to call if fevers/chills, erythema, induration, drainage, or persistent bleeding.  Images permanently stored and available for review in the ultrasound unit.  Impression: Technically successful ultrasound guided injection.

## 2013-01-14 NOTE — Assessment & Plan Note (Addendum)
Euflexxa placed in left knee with some steroid. I'm reordering 3 boxes of Euflexxa for his right knee, he will pick this up from the pharmacy and likely will have it shipped to [place]. He will come back in one week for Euflexxa injection #2 of 3 in his left knee #1 of 3 in his right knee.

## 2013-01-17 ENCOUNTER — Telehealth: Payer: Self-pay

## 2013-01-17 NOTE — Telephone Encounter (Signed)
Jason Alvarado states his knee pain is worse after his injection. He has more trouble walking down stairs and is only able to walk for about 10 minutes before the pain startes.

## 2013-01-17 NOTE — Telephone Encounter (Signed)
This sounds like a steroid flare. This is uncommon but can happen. Ice for 20 minutes 3-4 times a day, apply compression, should subside in a day or so and start to get much better.

## 2013-01-17 NOTE — Telephone Encounter (Signed)
Patient advised.

## 2013-01-21 ENCOUNTER — Ambulatory Visit: Payer: Private Health Insurance - Indemnity | Admitting: Sports Medicine

## 2013-01-22 ENCOUNTER — Ambulatory Visit (INDEPENDENT_AMBULATORY_CARE_PROVIDER_SITE_OTHER): Payer: Private Health Insurance - Indemnity | Admitting: Sports Medicine

## 2013-01-22 ENCOUNTER — Other Ambulatory Visit: Payer: Self-pay | Admitting: Cardiology

## 2013-01-22 ENCOUNTER — Encounter: Payer: Self-pay | Admitting: Sports Medicine

## 2013-01-22 VITALS — BP 93/52 | HR 64

## 2013-01-22 DIAGNOSIS — M1710 Unilateral primary osteoarthritis, unspecified knee: Secondary | ICD-10-CM

## 2013-01-22 DIAGNOSIS — M170 Bilateral primary osteoarthritis of knee: Secondary | ICD-10-CM

## 2013-01-22 DIAGNOSIS — IMO0002 Reserved for concepts with insufficient information to code with codable children: Secondary | ICD-10-CM

## 2013-01-22 NOTE — Assessment & Plan Note (Signed)
Euflexxa injection #2 of 3 placed in left knee. He has already noted a tremendous decrease in his pain. He will return in one week for Euflexxa injection #3 of 3 into left knee. Afterwards we will start Euflexxa series in his right knee.

## 2013-01-22 NOTE — Progress Notes (Signed)
Procedure: Real-time Ultrasound Guided Injection of left knee. Device: GE Logiq E  Ultrasound guided injection is preferred based studies that show increased duration, increased effect, greater accuracy, decreased procedural pain, increased response rate, and decreased cost with ultrasound guided versus blind injection.  Verbal informed consent obtained.  Time-out conducted.  Noted no overlying erythema, induration, or other signs of local infection.  Skin prepped in a sterile fashion.  Local anesthesia: Topical Ethyl chloride.  With sterile technique and under real time ultrasound guidance:  20 mg/2 mL of Euflexxa (sodium hyaluronate) in a prefilled syringe was injected easily into the knee through a 22-gauge needle. Completed without difficulty  Pain immediately resolved suggesting accurate placement of the medication.  Advised to call if fevers/chills, erythema, induration, drainage, or persistent bleeding.  Images permanently stored and available for review in the ultrasound unit.  Impression: Technically successful ultrasound guided injection.

## 2013-01-31 ENCOUNTER — Encounter: Payer: Self-pay | Admitting: Sports Medicine

## 2013-01-31 ENCOUNTER — Ambulatory Visit (INDEPENDENT_AMBULATORY_CARE_PROVIDER_SITE_OTHER): Payer: Private Health Insurance - Indemnity | Admitting: Sports Medicine

## 2013-01-31 VITALS — BP 123/69 | HR 55 | Wt 171.0 lb

## 2013-01-31 DIAGNOSIS — M170 Bilateral primary osteoarthritis of knee: Secondary | ICD-10-CM

## 2013-01-31 DIAGNOSIS — M1710 Unilateral primary osteoarthritis, unspecified knee: Secondary | ICD-10-CM

## 2013-01-31 DIAGNOSIS — IMO0002 Reserved for concepts with insufficient information to code with codable children: Secondary | ICD-10-CM

## 2013-01-31 NOTE — Progress Notes (Signed)
Procedure: Real-time Ultrasound Guided Injection of left knee. Device: GE Logiq E  Ultrasound guided injection is preferred based studies that show increased duration, increased effect, greater accuracy, decreased procedural pain, increased response rate, and decreased cost with ultrasound guided versus blind injection.  Verbal informed consent obtained.  Time-out conducted.  Noted no overlying erythema, induration, or other signs of local infection.  Skin prepped in a sterile fashion.  Local anesthesia: Topical Ethyl chloride.  With sterile technique and under real time ultrasound guidance:  20 mg/2 mL of Euflexxa (sodium hyaluronate) in a prefilled syringe was injected easily into the knee through a 22-gauge needle. Completed without difficulty  Pain immediately resolved suggesting accurate placement of the medication.  Advised to call if fevers/chills, erythema, induration, drainage, or persistent bleeding.  Images permanently stored and available for review in the ultrasound unit.  Impression: Technically successful ultrasound guided injection. 

## 2013-01-31 NOTE — Assessment & Plan Note (Signed)
Euflexxa injection #3 of 3 placed in left knee. He is noted greater than 50% decrease in his pain. Afterwards we will start Euflexxa series in his right knee, he would like to take a break in the injections, and will come back in a month to start Euflexxa in the right knee. He does have the prescription for Euflexxa, and will likely have it shipped to [place].

## 2013-02-12 ENCOUNTER — Encounter: Payer: Self-pay | Admitting: Sports Medicine

## 2013-02-12 ENCOUNTER — Ambulatory Visit (INDEPENDENT_AMBULATORY_CARE_PROVIDER_SITE_OTHER): Payer: Private Health Insurance - Indemnity | Admitting: Sports Medicine

## 2013-02-12 VITALS — BP 135/80 | HR 68

## 2013-02-12 DIAGNOSIS — M170 Bilateral primary osteoarthritis of knee: Secondary | ICD-10-CM

## 2013-02-12 DIAGNOSIS — M1710 Unilateral primary osteoarthritis, unspecified knee: Secondary | ICD-10-CM

## 2013-02-12 DIAGNOSIS — IMO0002 Reserved for concepts with insufficient information to code with codable children: Secondary | ICD-10-CM

## 2013-02-12 NOTE — Assessment & Plan Note (Signed)
Finish entire [employer] series. Unfortunately has had a flare of pain within the last few days. I do suspect that there is a meniscal injury that may need arthroscopic debridement. I injected his knee today for relief, continue knee brace. MRI for surgical planning, he will return to see me to go over MRI results.

## 2013-02-12 NOTE — Progress Notes (Signed)
   Subjective:    CC: Left knee pain  HPI: This very pleasant 67 year old male comes back to see me with an increase in his pain. He finished a Euflexxa series about a week and a half ago, and he was doing very well, unfortunately he developed increasing pain he localizes at the medial joint line, and is extremely painful to bear weight. Denies any constitutional symptoms or redness. Denies any mechanical symptoms, pain is worse when trying to flex the knee past 90. He really doesn't have much swelling. Pain is severe, worsening.  Past medical history, Surgical history, Family history not pertinant except as noted below, Social history, Allergies, and medications have been entered into the medical record, reviewed, and no changes needed.   Review of Systems: No headache, visual changes, nausea, vomiting, diarrhea, constipation, dizziness, abdominal pain, skin rash, fevers, chills, night sweats, weight loss, swollen lymph nodes, body aches, joint swelling, muscle aches, chest pain, shortness of breath, mood changes, visual or auditory hallucinations.   Objective:   General: Well Developed, well nourished, and in no acute distress.  Neuro/Psych: Alert and oriented x3, extra-ocular muscles intact, able to move all 4 extremities, sensation grossly intact. Skin: Warm and dry, no rashes noted.  Respiratory: Not using accessory muscles, speaking in full sentences, trachea midline.  Cardiovascular: Pulses palpable, no extremity edema. Abdomen: Does not appear distended. Left Knee: Normal to inspection with no erythema or effusion or obvious bony abnormalities. There is tenderness to palpation along the medial joint line. I cannot flex the knee past 90 without pain. Ligaments with solid consistent endpoints including ACL, PCL, LCL, MCL. Negative Mcmurray's, Apley's, and Thessalonian tests. Non painful patellar compression. Patellar glide without crepitus. Patellar and quadriceps tendons  unremarkable. Hamstring and quadriceps strength is normal.   Procedure: Real-time Ultrasound Guided Injection of left knee Device: GE Logiq E  Ultrasound guided injection is preferred based studies that show increased duration, increased effect, greater accuracy, decreased procedural pain, increased response rate, and decreased cost with ultrasound guided versus blind injection.  Verbal informed consent obtained.  Time-out conducted.  Noted no overlying erythema, induration, or other signs of local infection.  Skin prepped in a sterile fashion.  Local anesthesia: Topical Ethyl chloride.  With sterile technique and under real time ultrasound guidance:  2 cc Kenalog 40, 4 cc lidocaine injected into the suprapatellar recess, no effusion was seen. Completed without difficulty  Pain immediately resolved suggesting accurate placement of the medication.  Advised to call if fevers/chills, erythema, induration, drainage, or persistent bleeding.  Images permanently stored and available for review in the ultrasound unit.  Impression: Technically successful ultrasound guided injection. Impression and Recommendations:   This case required medical decision making of moderate complexity.

## 2013-02-14 ENCOUNTER — Telehealth: Payer: Self-pay | Admitting: *Deleted

## 2013-02-14 NOTE — Telephone Encounter (Signed)
PA obtained for MRI LE joint w/o contrast. Approval Ref # is [id number].

## 2013-02-22 ENCOUNTER — Ambulatory Visit (HOSPITAL_BASED_OUTPATIENT_CLINIC_OR_DEPARTMENT_OTHER)
Admission: RE | Admit: 2013-02-22 | Discharge: 2013-02-22 | Disposition: A | Discharge status: Home / Self Care | Payer: Managed Care, Other (non HMO) | Source: Ambulatory Visit | Attending: Sports Medicine | Admitting: Sports Medicine

## 2013-02-22 DIAGNOSIS — M7130 Other bursal cyst, unspecified site: Secondary | ICD-10-CM | POA: Insufficient documentation

## 2013-02-22 DIAGNOSIS — M2390 Unspecified internal derangement of unspecified knee: Secondary | ICD-10-CM | POA: Insufficient documentation

## 2013-02-22 DIAGNOSIS — M1710 Unilateral primary osteoarthritis, unspecified knee: Secondary | ICD-10-CM | POA: Insufficient documentation

## 2013-02-22 DIAGNOSIS — M2240 Chondromalacia patellae, unspecified knee: Secondary | ICD-10-CM | POA: Insufficient documentation

## 2013-02-22 DIAGNOSIS — M25569 Pain in unspecified knee: Secondary | ICD-10-CM | POA: Insufficient documentation

## 2013-02-22 DIAGNOSIS — M170 Bilateral primary osteoarthritis of knee: Secondary | ICD-10-CM

## 2013-02-28 ENCOUNTER — Telehealth: Payer: Self-pay | Admitting: *Deleted

## 2013-02-28 NOTE — Telephone Encounter (Signed)
1. Tricompartmental osteoarthritis, most advanced within the  medial compartment. No acute osteochondral findings identified.  2. 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  3. Diminutive diffusely degenerated medial meniscus without  displaced meniscal fragment.  4. Probable small longitudinal tear involving the anterior horn of  the lateral meniscus.  I had asked him to come back for MRI results and to go over images, he may want to, to find out what the above means.

## 2013-02-28 NOTE — Telephone Encounter (Signed)
Pt calls and wants to know the results of his MRI of knees he had done.

## 2013-03-03 NOTE — Telephone Encounter (Signed)
Pt notified and sent to scheduling to make appointment with you. Barry Dienes, LPN

## 2013-03-04 ENCOUNTER — Ambulatory Visit (INDEPENDENT_AMBULATORY_CARE_PROVIDER_SITE_OTHER): Payer: Managed Care, Other (non HMO) | Admitting: Sports Medicine

## 2013-03-04 ENCOUNTER — Encounter: Payer: Self-pay | Admitting: Sports Medicine

## 2013-03-04 ENCOUNTER — Telehealth: Payer: Self-pay | Admitting: Family Medicine

## 2013-03-04 VITALS — BP 132/80 | HR 69 | Wt 170.0 lb

## 2013-03-04 DIAGNOSIS — IMO0002 Reserved for concepts with insufficient information to code with codable children: Secondary | ICD-10-CM

## 2013-03-04 DIAGNOSIS — M1710 Unilateral primary osteoarthritis, unspecified knee: Secondary | ICD-10-CM

## 2013-03-04 DIAGNOSIS — M170 Bilateral primary osteoarthritis of knee: Secondary | ICD-10-CM

## 2013-03-04 MED ORDER — CELECOXIB 200 MG PO CAPS: ORAL_CAPSULE | ORAL | Status: DC

## 2013-03-04 NOTE — Progress Notes (Signed)
  Subjective:    CC: Follow up  HPI: Left knee osteoarthritis: this very pleasant 67 year old male has had 2 intra-articular steroid injections, as well as 2 series of Euflexxa. Unfortunately his pain is persistent, and at the last visit was so severe I performed an additional steroid injection, and obtain an MRI. He has moderate to severe arthritis on his x-ray, particularly in the medial compartment, and similar findings on his MRI. Pain continues to be localized predominately over the joint lines, without radiation.  Past medical history, Surgical history, Family history not pertinant except as noted below, Social history, Allergies, and medications have been entered into the medical record, reviewed, and no changes needed.   Review of Systems: No fevers, chills, night sweats, weight loss, chest pain, or shortness of breath.   Objective:    General: Well Developed, well nourished, and in no acute distress.  Neuro: Alert and oriented x3, extra-ocular muscles intact, sensation grossly intact.  HEENT: Normocephalic, atraumatic, pupils equal round reactive to light, neck supple, no masses, no lymphadenopathy, thyroid nonpalpable.  Skin: Warm and dry, no rashes. Cardiac: Regular rate and rhythm, no murmurs rubs or gallops, no lower extremity edema.  Respiratory: Clear to auscultation bilaterally. Not using accessory muscles, speaking in full sentences. Left Knee: There is no effusion, but he does have moderate tenderness at the joint lines. ROM full in flexion and extension and lower leg rotation. Ligaments with solid consistent endpoints including ACL, PCL, LCL, MCL. Negative Mcmurray's, Apley's, and Thessalonian tests. Non painful patellar compression. Patellar glide without crepitus. Patellar and quadriceps tendons unremarkable. Hamstring and quadriceps strength is normal.   MRI shows evidence of prior partial medial meniscectomy, severe DJD particularly of the medial compartment, and a  torn anterior horn of the lateral meniscus. Impression and Recommendations:

## 2013-03-04 NOTE — Telephone Encounter (Signed)
Patient thinks Dr. Linford Arnold is GREAT as a pcp but now that we have male providers in our office, he would like to switch his pcp to Dr. Briant Sites.. Please let me know THIS is okay?? Thanks, [employer]

## 2013-03-04 NOTE — Assessment & Plan Note (Signed)
Severe osteoarthritis, left is the most painful. He has already been through 2 courses of Euflexxa, as well as steroid injections. Pain is persistent, and MRI shows severe DJD. I think he will probably need a total knee arthroplasty. I'm going to set him up with you for orthopedics for consideration. Celebrex in the meantime.

## 2013-03-04 NOTE — Telephone Encounter (Signed)
That is fine.  Ok to change.

## 2013-05-21 ENCOUNTER — Encounter: Payer: Self-pay | Admitting: *Deleted

## 2013-05-21 ENCOUNTER — Emergency Department
Admission: EM | Admit: 2013-05-21 | Discharge: 2013-05-21 | Disposition: A | Discharge status: Home / Self Care | Payer: Managed Care, Other (non HMO) | Source: Home / Self Care | Attending: Family Medicine | Admitting: Family Medicine

## 2013-05-21 ENCOUNTER — Ambulatory Visit (INDEPENDENT_AMBULATORY_CARE_PROVIDER_SITE_OTHER): Payer: Managed Care, Other (non HMO) | Admitting: Sports Medicine

## 2013-05-21 DIAGNOSIS — M170 Bilateral primary osteoarthritis of knee: Secondary | ICD-10-CM

## 2013-05-21 DIAGNOSIS — M1710 Unilateral primary osteoarthritis, unspecified knee: Secondary | ICD-10-CM

## 2013-05-21 DIAGNOSIS — M4810 Ankylosing hyperostosis [Forestier], site unspecified: Secondary | ICD-10-CM

## 2013-05-21 DIAGNOSIS — M545 Low back pain, unspecified: Secondary | ICD-10-CM

## 2013-05-21 DIAGNOSIS — IMO0002 Reserved for concepts with insufficient information to code with codable children: Secondary | ICD-10-CM

## 2013-05-21 HISTORY — DX: Ankylosing hyperostosis (forestier), site unspecified: M48.10

## 2013-05-21 MED ORDER — PREDNISONE 50 MG PO TABS: ORAL_TABLET | ORAL | Status: DC

## 2013-05-21 NOTE — ED Provider Notes (Signed)
CSN: [id number]     Arrival date & time 05/21/13  4098 History     First MD Initiated Contact with Patient 05/21/13 [phone]     Chief Complaint  Patient presents with  . Back Pain    Patient is a 67 y.o. male presenting with back pain.  Back Pain   Back pain x 4 days  Has baseline hx/o chronic back pain in setting of AS, DISH, and degenerative disc disease ( dxd by MRI 2011) Was working in garden over the weekend.  Had some low back pain that progressively got worse over the course of the week.  Pain mainly in lumbar region but has also radiated into buttocks.  No radicular sxs. Has tried flexeril and mobic at home with minimal improvement in sxs.    Past Medical History  Diagnosis Date  . DDD (degenerative disc disease), lumbar     MRI 03-2010 in [place]  . CAD (coronary artery disease) 1994, 2002    angioplasty 1994, stented x 1 2002  . Hyperlipidemia   . Acute MI 1994  . DISH (diffuse idiopathic skeletal hyperostosis)    Past Surgical History  Procedure Laterality Date  . Hernia repair  1994    repeat 1995, 2001 groin  . Coronary angioplasty with stent placement  2002   Family History  Problem Relation Age of Onset  . Heart disease Father     MI at age 46  . Breast cancer Sister   . Rheum arthritis Mother    History  Substance Use Topics  . Smoking status: Former Smoker    Types: Cigarettes    Quit date: 10/23/1992  . Smokeless tobacco: Never Used  . Alcohol Use: 0.6 oz/week    1 Glasses of wine per week     Comment: Occasionally    Review of Systems  Musculoskeletal: Positive for back pain.  All other systems reviewed and are negative.    Allergies  Review of patient's allergies indicates no known allergies.  Home Medications   Current Outpatient Rx  Name  Route  Sig  Dispense  Refill  . aspirin 81 MG tablet   Oral   Take 81 mg by mouth daily.           . celecoxib (CELEBREX) 200 MG capsule      One to 2 tablets by mouth daily as needed for  pain.   180 capsule   2   . CRESTOR 40 MG tablet      TAKE 1 TABLET DAILY   90 tablet   2   . Multiple Vitamin (MULTIVITAMIN PO)   Oral   Take by mouth.           . Sodium Hyaluronate, Viscosup, (EUFLEXXA) 20 MG/2ML SOLN   Intra-articular   Inject 2 mLs into the articular space once a week.   3 vial   0    BP 112/79  Pulse 86  Resp 16  Ht 5\' 9"  (1.753 m)  Wt 173 lb (78.472 kg)  BMI 25.54 kg/m2  SpO2 99% Physical Exam  Constitutional: He appears well-developed and well-nourished.  HENT:  Head: Normocephalic and atraumatic.  Eyes: Conjunctivae are normal. Pupils are equal, round, and reactive to light.  Neck: Normal range of motion.  Cardiovascular: Normal rate and regular rhythm.   Pulmonary/Chest: Effort normal.  Abdominal: Soft.  Musculoskeletal:       Back:  + TTP over affected area  + pain with back extension FABER mildly positive  bilaterally    Skin: Skin is warm.    ED Course   Procedures (including critical care time)  Labs Reviewed - No data to display No results found. 1. Low back pain     MDM  Suspect flare of baseline AS and DISH secondary to over exertion.  Would likely benefit from course of prednisone Will consult sports medicine given baseline extensive lumbar disease.  Treatment plan and follow up per sports medicine.     The patient and/or caregiver has been counseled thoroughly with regard to treatment plan and/or medications prescribed including dosage, schedule, interactions, rationale for use, and possible side effects and they verbalize understanding. Diagnoses and expected course of recovery discussed and will return if not improved as expected or if the condition worsens. Patient and/or caregiver verbalized understanding.       Doree Albee, MD 05/21/13 1014

## 2013-05-21 NOTE — Progress Notes (Signed)
   Subjective:    I'm seeing this patient as a consultation for:  Dr. Alvester Morin  CC: Back pain  HPI: This is a very pleasant 67 year old male who I've been seeing for bilateral knee osteoarthritis. He does have a history of diffuse idiopathic skeletal hyperostosis, more recently he was working in the yard, and now has developed pain he localizes on both sides of his lower lumbar spine radiating around to the lateral aspect of both hips. He denies any symptoms of claudication, pain is worse when sitting, flexion, and bad with Valsalva. There is moderate, persistent.  Past medical history, Surgical history, Family history not pertinant except as noted below, Social history, Allergies, and medications have been entered into the medical record, reviewed, and no changes needed.   Review of Systems: No headache, visual changes, nausea, vomiting, diarrhea, constipation, dizziness, abdominal pain, skin rash, fevers, chills, night sweats, weight loss, swollen lymph nodes, body aches, joint swelling, muscle aches, chest pain, shortness of breath, mood changes, visual or auditory hallucinations.   Objective:   General: Well Developed, well nourished, and in no acute distress.  Neuro/Psych: Alert and oriented x3, extra-ocular muscles intact, able to move all 4 extremities, sensation grossly intact. Skin: Warm and dry, no rashes noted.  Respiratory: Not using accessory muscles, speaking in full sentences, trachea midline.  Cardiovascular: Pulses palpable, no extremity edema. Abdomen: Does not appear distended. Back Exam:  Inspection: Unremarkable  Motion: Flexion 45 deg, Extension 45 deg, Side Bending to 45 deg bilaterally,  Rotation to 45 deg bilaterally  SLR laying: Negative  XSLR laying: Negative  Palpable tenderness: None. FABER: negative. Sensory change: Gross sensation intact to all lumbar and sacral dermatomes.  Reflexes: 2+ at both patellar tendons, 2+ at achilles tendons, Babinski's downgoing.   Strength at foot  Plantar-flexion: 5/5 Dorsi-flexion: 5/5 Eversion: 5/5 Inversion: 5/5  Leg strength  Quad: 5/5 Hamstring: 5/5 Hip flexor: 5/5 Hip abductors: 5/5  Gait unremarkable.  I reviewed his MRI from 2011, there is moderate osteophytosis over the lumbar vertebrae, there is also a small disc protrusion at the L5-S1 level and a much larger disc protrusion at the L4-L5 level causing bilateral foraminal stenosis and only mild central canal stenosis. Facet joints looked okay.  Impression and Recommendations:   This case required medical decision making of moderate complexity.

## 2013-05-21 NOTE — Assessment & Plan Note (Signed)
Knee pain has resolved Visco supplementation.

## 2013-05-21 NOTE — Assessment & Plan Note (Signed)
I did review an MRI from 2011. Jason Alvarado does have degenerative disc disease at the L5-S1, and worsen with the L4-L5 level causing bilateral foraminal stenosis. This is the likely cause of his symptoms, and we will treat him conservatively. Prednisone, continue Celebrex, home exercises, return to see me in 2-3 weeks

## 2013-05-21 NOTE — ED Notes (Signed)
Jason Alvarado reports chromic back pain with a hx of DISH and ankylosing spondylosis. His lower back pain flared up 3 days ago. Pain radiates to hips and is constant.  He has used heat and ice, and flexeril.

## 2013-05-22 ENCOUNTER — Telehealth: Payer: Self-pay | Admitting: *Deleted

## 2013-05-22 NOTE — Telephone Encounter (Signed)
See previous phone note.  

## 2013-06-18 ENCOUNTER — Encounter: Payer: Self-pay | Admitting: Sports Medicine

## 2013-07-01 ENCOUNTER — Encounter: Payer: Self-pay | Admitting: Sports Medicine

## 2013-07-01 DIAGNOSIS — E785 Hyperlipidemia, unspecified: Secondary | ICD-10-CM

## 2013-07-09 LAB — LIPID PANEL
Cholesterol: 136 mg/dL (ref 0–200)
HDL: 55 mg/dL (ref >39)
LDL Cholesterol: 64 mg/dL (ref 0–99)
Total CHOL/HDL Ratio: 2.5 Ratio
Triglycerides: 87 mg/dL (ref <150)
VLDL: 17 mg/dL (ref 0–40)

## 2013-07-09 LAB — CBC
HCT: 42.1 % (ref 39.0–52.0)
Hemoglobin: 14.5 g/dL (ref 13.0–17.0)
MCH: 32.5 pg (ref 26.0–34.0)
MCHC: 34.4 g/dL (ref 30.0–36.0)
MCV: 94.4 fL (ref 78.0–100.0)
Platelets: 147 10*3/uL — ABNORMAL LOW (ref 150–400)
RBC: 4.46 MIL/uL (ref 4.22–5.81)
RDW: 13.4 % (ref 11.5–15.5)
WBC: 6.6 10*3/uL (ref 4.0–10.5)

## 2013-07-09 LAB — COMPREHENSIVE METABOLIC PANEL
BUN: 14 mg/dL (ref 6–23)
CO2: 31 mEq/L (ref 19–32)
Calcium: 9.4 mg/dL (ref 8.4–10.5)
Chloride: 103 mEq/L (ref 96–112)
Creat: 0.83 mg/dL (ref 0.50–1.35)

## 2013-07-09 LAB — COMPREHENSIVE METABOLIC PANEL WITH GFR
ALT: 23 U/L (ref 0–53)
AST: 22 U/L (ref 0–37)
Albumin: 4.2 g/dL (ref 3.5–5.2)
Alkaline Phosphatase: 38 U/L — ABNORMAL LOW (ref 39–117)
Glucose, Bld: 78 mg/dL (ref 70–99)
Potassium: 4.2 meq/L (ref 3.5–5.3)
Sodium: 141 meq/L (ref 135–145)
Total Bilirubin: 0.7 mg/dL (ref 0.3–1.2)
Total Protein: 6.5 g/dL (ref 6.0–8.3)

## 2013-07-09 LAB — HEMOGLOBIN A1C
Hgb A1c MFr Bld: 5.7 % — ABNORMAL HIGH (ref <5.7)
Mean Plasma Glucose: 117 mg/dL — ABNORMAL HIGH (ref <117)

## 2013-08-01 ENCOUNTER — Encounter: Payer: Self-pay | Admitting: Sports Medicine

## 2013-08-01 ENCOUNTER — Ambulatory Visit (INDEPENDENT_AMBULATORY_CARE_PROVIDER_SITE_OTHER): Payer: Managed Care, Other (non HMO) | Admitting: Sports Medicine

## 2013-08-01 VITALS — BP 137/74 | HR 68 | Wt 170.0 lb

## 2013-08-01 DIAGNOSIS — M170 Bilateral primary osteoarthritis of knee: Secondary | ICD-10-CM

## 2013-08-01 DIAGNOSIS — E785 Hyperlipidemia, unspecified: Secondary | ICD-10-CM

## 2013-08-01 DIAGNOSIS — Z0000 Encounter for general adult medical examination without abnormal findings: Secondary | ICD-10-CM | POA: Insufficient documentation

## 2013-08-01 DIAGNOSIS — R5381 Other malaise: Secondary | ICD-10-CM | POA: Insufficient documentation

## 2013-08-01 DIAGNOSIS — I10 Essential (primary) hypertension: Secondary | ICD-10-CM

## 2013-08-01 DIAGNOSIS — M1710 Unilateral primary osteoarthritis, unspecified knee: Secondary | ICD-10-CM

## 2013-08-01 DIAGNOSIS — M4810 Ankylosing hyperostosis [Forestier], site unspecified: Secondary | ICD-10-CM

## 2013-08-01 DIAGNOSIS — Z299 Encounter for prophylactic measures, unspecified: Secondary | ICD-10-CM

## 2013-08-01 DIAGNOSIS — IMO0002 Reserved for concepts with insufficient information to code with codable children: Secondary | ICD-10-CM

## 2013-08-01 MED ORDER — TRAMADOL HCL 50 MG PO TABS: ORAL_TABLET | ORAL | Status: DC

## 2013-08-01 NOTE — Assessment & Plan Note (Signed)
Stable and well controlled on Crestor.

## 2013-08-01 NOTE — Assessment & Plan Note (Signed)
Well-controlled, no changes needed 

## 2013-08-01 NOTE — Assessment & Plan Note (Signed)
Checking testosterone levels and TSH. Mood is good.

## 2013-08-01 NOTE — Progress Notes (Signed)
  Subjective:    CC: Complete physical  HPI:  Hyperlipidemia: Stable on Crestor.  Hypertension: Stable.  Left knee osteoarthritis: Finish the Euflexxa series 3 months ago, pain has started to return, desires injection. He also asked me about the unloader brace. He does note that he is proceeding to total knee arthroplasty but is trying to put this off for as long as possible.  Fatigue: Generalized, mood is good, just feels tired. No major changes in his life, overall very happy.  Preventive measures: Complete physical will be done today, up-to-date on other screening measures.  Past medical history, Surgical history, Family history not pertinant except as noted below, Social history, Allergies, and medications have been entered into the medical record, reviewed, and no changes needed.   Review of Systems: No headache, visual changes, nausea, vomiting, diarrhea, constipation, dizziness, abdominal pain, skin rash, fevers, chills, night sweats, swollen lymph nodes, weight loss, chest pain, body aches, joint swelling, muscle aches, shortness of breath, mood changes, visual or auditory hallucinations.  Objective:    General: Well Developed, well nourished, and in no acute distress.  Neuro: Alert and oriented x3, extra-ocular muscles intact, sensation grossly intact.  HEENT: Normocephalic, atraumatic, pupils equal round reactive to light, neck supple, no masses, no lymphadenopathy, thyroid nonpalpable.  Skin: Warm and dry, no rashes noted.  Cardiac: Regular rate and rhythm, no murmurs rubs or gallops.  Respiratory: Clear to auscultation bilaterally. Not using accessory muscles, speaking in full sentences.  Abdominal: Soft, nontender, nondistended, positive bowel sounds, no masses, no organomegaly.   Procedure: Real-time Ultrasound Guided Injection of left knee Device: GE Logiq E  Verbal informed consent obtained.  Time-out conducted.  Noted no overlying erythema, induration, or other signs  of local infection.  Skin prepped in a sterile fashion.  Local anesthesia: Topical Ethyl chloride.  With sterile technique and under real time ultrasound guidance:  2 cc Kenalog 40, 4 cc lidocaine injected into the suprapatellar recess, there was a moderate effusion. Completed without difficulty  Pain immediately resolved suggesting accurate placement of the medication.  Advised to call if fevers/chills, erythema, induration, drainage, or persistent bleeding.  Images permanently stored and available for review in the ultrasound unit.  Impression: Technically successful ultrasound guided injection.  Impression and Recommendations:    The patient was counselled, risk factors were discussed, anticipatory guidance given.

## 2013-08-01 NOTE — Assessment & Plan Note (Signed)
With lumbar degenerative disc disease, asymptomatic.

## 2013-08-01 NOTE — Assessment & Plan Note (Signed)
Complete physical performed. Return in one year.

## 2013-08-01 NOTE — Assessment & Plan Note (Addendum)
Only a three-month response to Euflexxa. We injected today into the left knee. He is a candidate for the unloader brace, we will contact the [employer] representative to have him fitted. Certainly total knee arthroplasty is in the horizon. Increasing pain medication to tramadol.

## 2013-08-02 LAB — TSH: TSH: 1.522 u[IU]/mL (ref 0.350–4.500)

## 2013-08-04 LAB — TESTOSTERONE, FREE, TOTAL, SHBG
Sex Hormone Binding: 32 nmol/L (ref 13–71)
Testosterone, Free: 72.8 pg/mL (ref 47.0–244.0)
Testosterone-% Free: 2 % (ref 1.6–2.9)
Testosterone: 357 ng/dL (ref 300–890)

## 2013-08-28 ENCOUNTER — Other Ambulatory Visit: Payer: Self-pay

## 2013-10-24 ENCOUNTER — Other Ambulatory Visit: Payer: Self-pay

## 2013-10-24 MED ORDER — ROSUVASTATIN CALCIUM 40 MG PO TABS: ORAL_TABLET | ORAL | Status: DC

## 2013-10-30 ENCOUNTER — Telehealth: Payer: Self-pay | Admitting: *Deleted

## 2013-10-30 NOTE — Telephone Encounter (Signed)
I have not seen this pt in 2 years; will need ov prior to changing meds or they would need to be filled by primary care. Jason Alvarado

## 2013-10-30 NOTE — Telephone Encounter (Signed)
Pharmacy stated that crestor is no longer covered for this patient. She wanted to know if he could be switched to simvastatin or atorvastatin. Please advise. Thanks, MI

## 2013-10-31 NOTE — Telephone Encounter (Signed)
Pharmacy and patient are aware. Patient would like an appointment so I transferred to scheduling for appointment.

## 2013-10-31 NOTE — Telephone Encounter (Signed)
Pharmacy

## 2013-11-12 ENCOUNTER — Encounter: Payer: Self-pay | Admitting: Sports Medicine

## 2013-11-12 ENCOUNTER — Ambulatory Visit (INDEPENDENT_AMBULATORY_CARE_PROVIDER_SITE_OTHER): Payer: Medicare HMO | Admitting: Sports Medicine

## 2013-11-12 VITALS — BP 122/63 | HR 54 | Ht 69.0 in | Wt 171.0 lb

## 2013-11-12 DIAGNOSIS — M170 Bilateral primary osteoarthritis of knee: Secondary | ICD-10-CM

## 2013-11-12 DIAGNOSIS — IMO0002 Reserved for concepts with insufficient information to code with codable children: Secondary | ICD-10-CM

## 2013-11-12 DIAGNOSIS — M1710 Unilateral primary osteoarthritis, unspecified knee: Secondary | ICD-10-CM

## 2013-11-12 NOTE — Progress Notes (Signed)
  Subjective:    CC: Followup  HPI: Knee osteoarthritis:  Status post several steroid injections, he is already been through a full series of Euflexxa, currently taking NSAIDs. Last injection lasted 3 months, desires repeat injection today.  Pain is localized medial joint line, moderate, persistent without radiation.  Past medical history, Surgical history, Family history not pertinant except as noted below, Social history, Allergies, and medications have been entered into the medical record, reviewed, and no changes needed.   Review of Systems: No fevers, chills, night sweats, weight loss, chest pain, or shortness of breath.   Objective:    General: Well Developed, well nourished, and in no acute distress.  Neuro: Alert and oriented x3, extra-ocular muscles intact, sensation grossly intact.  HEENT: Normocephalic, atraumatic, pupils equal round reactive to light, neck supple, no masses, no lymphadenopathy, thyroid nonpalpable.  Skin: Warm and dry, no rashes. Cardiac: Regular rate and rhythm, no murmurs rubs or gallops, no lower extremity edema.  Respiratory: Clear to auscultation bilaterally. Not using accessory muscles, speaking in full sentences.  Procedure: Real-time Ultrasound Guided Injection of left knee  Device: GE Logiq E  Verbal informed consent obtained.  Time-out conducted.  Noted no overlying erythema, induration, or other signs of local infection.  Skin prepped in a sterile fashion.  Local anesthesia: Topical Ethyl chloride.  With sterile technique and under real time ultrasound guidance:  2 cc Kenalog 40, 4 cc lidocaine injected easily into the suprapatellar recess. Completed without difficulty  Pain immediately resolved suggesting accurate placement of the medication.  Advised to call if fevers/chills, erythema, induration, drainage, or persistent bleeding.  Images permanently stored and available for review in the ultrasound unit.  Impression: Technically successful  ultrasound guided injection.  Impression and Recommendations:

## 2013-11-12 NOTE — Assessment & Plan Note (Signed)
This pleasant 68 year old male has nearly exhausted nonoperative measures for his knee osteoarthritis. We have done steroid injections which have had a temporary benefit, we have done a full series of Euflexxa which lasted only a few months, his last steroid injection lasted 3 months and he was given one today. At this point he is ready to consider total knee arthroplasty, unfortunately [facility] in [facility]. We are going to try Dr. Sharlyne PacasStephan Pill down the road.

## 2013-11-24 ENCOUNTER — Telehealth: Payer: Self-pay

## 2013-11-24 NOTE — Telephone Encounter (Signed)
Patient called to check on referral, I gave patient the number to Dr. Jeannett SeniorStephen Pill office to check on appt. Shantel Helwig,CMA

## 2013-12-05 ENCOUNTER — Encounter: Payer: Self-pay | Admitting: Sports Medicine

## 2013-12-08 MED ORDER — ROSUVASTATIN CALCIUM 40 MG PO TABS: ORAL_TABLET | ORAL | Status: DC

## 2013-12-17 ENCOUNTER — Ambulatory Visit: Payer: Medicare HMO | Admitting: Cardiology

## 2014-02-04 ENCOUNTER — Encounter: Payer: Self-pay | Admitting: Sports Medicine

## 2014-02-09 ENCOUNTER — Other Ambulatory Visit: Payer: Self-pay | Admitting: Sports Medicine

## 2014-02-09 MED ORDER — ROSUVASTATIN CALCIUM 40 MG PO TABS: 40.0000 mg | ORAL_TABLET | Freq: Every day | ORAL | Status: DC

## 2014-02-10 ENCOUNTER — Encounter: Payer: Self-pay | Admitting: Sports Medicine

## 2014-02-10 ENCOUNTER — Ambulatory Visit (INDEPENDENT_AMBULATORY_CARE_PROVIDER_SITE_OTHER): Payer: Medicare HMO | Admitting: Sports Medicine

## 2014-02-10 VITALS — BP 129/72 | HR 98 | Ht 69.0 in | Wt 164.0 lb

## 2014-02-10 DIAGNOSIS — Z96659 Presence of unspecified artificial knee joint: Secondary | ICD-10-CM

## 2014-02-10 MED ORDER — OXYCODONE HCL 20 MG PO TABS: 0.5000 | ORAL_TABLET | Freq: Every day | ORAL | Status: DC

## 2014-02-10 MED ORDER — MELOXICAM 15 MG PO TABS: ORAL_TABLET | ORAL | Status: DC

## 2014-02-10 MED ORDER — HYDROCODONE-ACETAMINOPHEN 10-325 MG PO TABS: 1.0000 | ORAL_TABLET | Freq: Three times a day (TID) | ORAL | Status: DC | PRN

## 2014-02-10 MED ORDER — DICLOFENAC EPOLAMINE 1.3 % TD PTCH: 0.5000 | MEDICATED_PATCH | Freq: Two times a day (BID) | TRANSDERMAL | Status: AC

## 2014-02-10 NOTE — Assessment & Plan Note (Signed)
Arthritic pain has completely resolved, unfortunately having some difficulty with pain control and difficulty getting in to see his orthopedic surgeon. I am going to provide a one-time treatment for his pain, will use Mobic daily for baseline pain control, diclofenac patches topical, 20 mg of oxycodone at bedtime, he will start with one half of these, and hydrocodone for use during the day.

## 2014-02-10 NOTE — Progress Notes (Addendum)
  Subjective:    CC: Knee pain  HPI: Approximately one month post left total knee arthroplasty, unable to get in with his orthopedic surgeon, he is having significant pain, worse at night. No constitutional symptoms.  Past medical history, Surgical history, Family history not pertinant except as noted below, Social history, Allergies, and medications have been entered into the medical record, reviewed, and no changes needed.   Review of Systems: No fevers, chills, night sweats, weight loss, chest pain, or shortness of breath.   Objective:    General: Well Developed, well nourished, and in no acute distress.  Neuro: Alert and oriented x3, extra-ocular muscles intact, sensation grossly intact.  HEENT: Normocephalic, atraumatic, pupils equal round reactive to light, neck supple, no masses, no lymphadenopathy, thyroid nonpalpable.  Skin: Warm and dry, no rashes. Cardiac: Regular rate and rhythm, no murmurs rubs or gallops, no lower extremity edema.  Respiratory: Clear to auscultation bilaterally. Not using accessory muscles, speaking in full sentences. Left Knee: Incision is clean, dry, intact, well healed. Knee is not swollen, slightly warm, but no erythema, or induration. Excellent range of motion from 0-110.   Impression and Recommendations:

## 2014-02-11 ENCOUNTER — Ambulatory Visit: Payer: Self-pay | Admitting: Sports Medicine

## 2014-04-24 ENCOUNTER — Encounter: Payer: Self-pay | Admitting: Sports Medicine

## 2014-04-24 DIAGNOSIS — H539 Unspecified visual disturbance: Secondary | ICD-10-CM

## 2014-12-18 ENCOUNTER — Encounter: Payer: Self-pay | Admitting: Sports Medicine

## 2014-12-18 ENCOUNTER — Ambulatory Visit (INDEPENDENT_AMBULATORY_CARE_PROVIDER_SITE_OTHER): Payer: Medicare HMO | Admitting: Sports Medicine

## 2014-12-18 VITALS — BP 123/72 | HR 58 | Ht 69.0 in | Wt 175.0 lb

## 2014-12-18 DIAGNOSIS — M5136 Other intervertebral disc degeneration, lumbar region: Secondary | ICD-10-CM | POA: Diagnosis not present

## 2014-12-18 DIAGNOSIS — Z0000 Encounter for general adult medical examination without abnormal findings: Secondary | ICD-10-CM | POA: Diagnosis not present

## 2014-12-18 DIAGNOSIS — I2510 Atherosclerotic heart disease of native coronary artery without angina pectoris: Secondary | ICD-10-CM

## 2014-12-18 DIAGNOSIS — Z96652 Presence of left artificial knee joint: Secondary | ICD-10-CM

## 2014-12-18 DIAGNOSIS — N139 Obstructive and reflux uropathy, unspecified: Secondary | ICD-10-CM

## 2014-12-18 DIAGNOSIS — I10 Essential (primary) hypertension: Secondary | ICD-10-CM

## 2014-12-18 DIAGNOSIS — I2583 Coronary atherosclerosis due to lipid rich plaque: Secondary | ICD-10-CM

## 2014-12-18 DIAGNOSIS — K219 Gastro-esophageal reflux disease without esophagitis: Secondary | ICD-10-CM

## 2014-12-18 DIAGNOSIS — M51369 Other intervertebral disc degeneration, lumbar region without mention of lumbar back pain or lower extremity pain: Secondary | ICD-10-CM

## 2014-12-18 LAB — COMPREHENSIVE METABOLIC PANEL
ALT: 18 U/L (ref 0–53)
AST: 18 U/L (ref 0–37)
Albumin: 4.4 g/dL (ref 3.5–5.2)
Alkaline Phosphatase: 53 U/L (ref 39–117)
BUN: 11 mg/dL (ref 6–23)
Calcium: 9.5 mg/dL (ref 8.4–10.5)
Potassium: 4.5 mEq/L (ref 3.5–5.3)
Sodium: 141 mEq/L (ref 135–145)
Total Bilirubin: 0.7 mg/dL (ref 0.2–1.2)
Total Protein: 7.1 g/dL (ref 6.0–8.3)

## 2014-12-18 LAB — CBC
HCT: 44.7 % (ref 39.0–52.0)
Hemoglobin: 15.3 g/dL (ref 13.0–17.0)
MCH: 31.6 pg (ref 26.0–34.0)
MCHC: 34.2 g/dL (ref 30.0–36.0)
MCV: 92.4 fL (ref 78.0–100.0)
MPV: 9.6 fL (ref 8.6–12.4)
Platelets: 157 10*3/uL (ref 150–400)
RBC: 4.84 MIL/uL (ref 4.22–5.81)
RDW: 13.8 % (ref 11.5–15.5)
WBC: 6.3 10*3/uL (ref 4.0–10.5)

## 2014-12-18 LAB — LIPID PANEL
Cholesterol: 172 mg/dL (ref 0–200)
HDL: 42 mg/dL (ref >=40)
LDL Cholesterol: 87 mg/dL (ref 0–99)
Total CHOL/HDL Ratio: 4.1 Ratio
Triglycerides: 214 mg/dL — ABNORMAL HIGH (ref <150)
VLDL: 43 mg/dL — ABNORMAL HIGH (ref 0–40)

## 2014-12-18 LAB — HEMOGLOBIN A1C
Hgb A1c MFr Bld: 5.8 % — ABNORMAL HIGH (ref <5.7)
Mean Plasma Glucose: 120 mg/dL — ABNORMAL HIGH (ref <117)

## 2014-12-18 LAB — COMPREHENSIVE METABOLIC PANEL WITH GFR
CO2: 29 meq/L (ref 19–32)
Chloride: 104 meq/L (ref 96–112)
Creat: 0.97 mg/dL (ref 0.50–1.35)
Glucose, Bld: 92 mg/dL (ref 70–99)

## 2014-12-18 MED ORDER — TAMSULOSIN HCL 0.4 MG PO CAPS: 0.4000 mg | ORAL_CAPSULE | Freq: Every day | ORAL | Status: AC

## 2014-12-18 MED ORDER — ROSUVASTATIN CALCIUM 40 MG PO TABS: 40.0000 mg | ORAL_TABLET | Freq: Every day | ORAL | Status: DC

## 2014-12-18 MED ORDER — AMBULATORY NON FORMULARY MEDICATION: Status: AC

## 2014-12-18 MED ORDER — MAGNESIUM OXIDE 400 MG PO TABS: 800.0000 mg | ORAL_TABLET | Freq: Every day | ORAL | Status: DC

## 2014-12-18 NOTE — Assessment & Plan Note (Signed)
Getting some cramping in the legs, adding magnesium oxide.

## 2014-12-18 NOTE — Assessment & Plan Note (Signed)
Overall doing well, getting some weakness, I have recommended formal PT.

## 2014-12-18 NOTE — Progress Notes (Signed)
Subjective:    Jason Alvarado is a 69 y.o. male who presents for Medicare Annual/Subsequent preventive examination.   Preventive Screening-Counseling & Management  Tobacco History  Smoking status  . Former Smoker  . Types: Cigarettes  . Quit date: 10/23/1992  Smokeless tobacco  . Never Used    Problems Prior to Visit 1. This patient also would like to discuss cramping in the legs, weakness from his knee replacement, obstructive uropathy,, these are all chronic and stable issues and our discussed further in the assessment and plan.   Current Problems (verified) Patient Active Problem List   Diagnosis Date Noted  . S/P Left total knee arthroplasty 02/10/2014  . Preventive measure 08/01/2013  . Other malaise and fatigue 08/01/2013  . Osteoarthritis of both knees 12/24/2012  . Lumbar degenerative disc disease 11/08/2011  . Chest pain 11/08/2011  . Coronary artery disease 05/15/2011  . Essential hypertension, benign 03/21/2011  . Hyperlipidemia 03/21/2011  . GERD (gastroesophageal reflux disease) 03/21/2011  . Diverticulosis of colon 03/21/2011    Medications Prior to Visit Current Outpatient Prescriptions on File Prior to Visit  Medication Sig Dispense Refill  . aspirin 81 MG tablet Take 81 mg by mouth daily.      . Cholecalciferol (VITAMIN D-3) 1000 UNITS CAPS Take by mouth.    . Coenzyme Q10 (COQ10) 100 MG CAPS Take by mouth.    . diclofenac (FLECTOR) 1.3 % PTCH Place 1 patch onto the skin 2 (two) times daily. 6 patch 0  . ibuprofen (ADVIL,MOTRIN) 800 MG tablet Take 800 mg by mouth every 8 (eight) hours as needed.    Marland Kitchen. KRILL OIL PO Take by mouth.    . Multiple Vitamin (MULTIVITAMIN PO) Take by mouth.       No current facility-administered medications on file prior to visit.    Current Medications (verified) Current Outpatient Prescriptions  Medication Sig Dispense Refill  . aspirin 81 MG tablet Take 81 mg by mouth daily.      . Cholecalciferol (VITAMIN D-3) 1000  UNITS CAPS Take by mouth.    . Coenzyme Q10 (COQ10) 100 MG CAPS Take by mouth.    . diclofenac (FLECTOR) 1.3 % PTCH Place 1 patch onto the skin 2 (two) times daily. 6 patch 0  . ibuprofen (ADVIL,MOTRIN) 800 MG tablet Take 800 mg by mouth every 8 (eight) hours as needed.    Marland Kitchen. KRILL OIL PO Take by mouth.    . Multiple Vitamin (MULTIVITAMIN PO) Take by mouth.      . rosuvastatin (CRESTOR) 40 MG tablet Take 1 tablet (40 mg total) by mouth daily. TAKE 1 TABLET DAILY 90 tablet 3   No current facility-administered medications for this visit.     Allergies (verified) Review of patient's allergies indicates no known allergies.   PAST HISTORY  Family History Family History  Problem Relation Age of Onset  . Heart disease Father     MI at age 70  . Breast cancer Sister   . Rheum arthritis Mother     Social History History  Substance Use Topics  . Smoking status: Former Smoker    Types: Cigarettes    Quit date: 10/23/1992  . Smokeless tobacco: Never Used  . Alcohol Use: 0.6 oz/week    1 Glasses of wine per week     Comment: Occasionally    Are there smokers in your home (other than you)?  No  Risk Factors Current exercise habits: Home exercise routine includes Regular exercise.  Dietary issues discussed:  No   Cardiac risk factors: advanced age (older than 48 for men, 66 for women), dyslipidemia and male gender.  Depression Screen (Note: if answer to either of the following is "Yes", a more complete depression screening is indicated)   Q1: Over the past two weeks, have you felt down, depressed or hopeless? No  Q2: Over the past two weeks, have you felt little interest or pleasure in doing things? No  Have you lost interest or pleasure in daily life? No  Do you often feel hopeless? No  Do you cry easily over simple problems? No  Activities of Daily Living In your present state of health, do you have any difficulty performing the following activities?:  Driving? No Managing  money?  No Feeding yourself? No Getting from bed to chair? No Climbing a flight of stairs? No Preparing food and eating?: No Bathing or showering? No Getting dressed: No Getting to the toilet? No Using the toilet:No Moving around from place to place: No In the past year have you fallen or had a near fall?:No   Are you sexually active?  Yes  Do you have more than one partner?  No  Hearing Difficulties: No Do you often ask people to speak up or repeat themselves? No Do you experience ringing or noises in your ears? No Do you have difficulty understanding soft or whispered voices? No   Do you feel that you have a problem with memory? No  Do you often misplace items? No  Do you feel safe at home?  Yes  Cognitive Testing  Alert? Yes  Normal Appearance?Yes  Oriented to person? Yes  Place? Yes   Time? Yes  Recall of three objects?  Yes  Can perform simple calculations? Yes  Displays appropriate judgment?Yes  Can read the correct time from a watch face?Yes   Advanced Directives have been discussed with the patient? No   List the Names of Other Physician/Practitioners you currently use: 1.    Indicate any recent Medical Services you may have received from other than [facility] providers in the past year (date may be approximate).  Immunization History  Administered Date(s) Administered  . Pneumococcal Polysaccharide-23 10/31/2011    Screening Tests Health Maintenance  Topic Date Due  . ZOSTAVAX  02/26/2005  . INFLUENZA VACCINE  05/23/2014  . TETANUS/TDAP  10/24/2015  . COLONOSCOPY  10/23/2017  . PNEUMOCOCCAL POLYSACCHARIDE VACCINE AGE 38 AND OVER  Completed    All answers were reviewed with the patient and necessary referrals were made:  Rodney Langton, MD   12/18/2014   History reviewed: allergies, current medications, past family history, past medical history, past social history, past surgical history and problem list  Review of Systems A comprehensive review  of systems was negative.    Objective:     Vision by Snellen chart: right eye:20/20, left eye:20/20 Blood pressure 123/72, pulse 58, height  (1.753 m), weight 175 lb (79.379 kg). Body mass index is 25.83 kg/(m^2).  General: Well Developed, well nourished, and in no acute distress.  Neuro: Alert and oriented x3, extra-ocular muscles intact, sensation grossly intact. Cranial nerves II through XII are intact, motor, sensory, and coordinative functions are all intact. HEENT: Normocephalic, atraumatic, pupils equal round reactive to light, neck supple, no masses, no lymphadenopathy, thyroid nonpalpable. Oropharynx, nasopharynx, external ear canals are unremarkable. Skin: Warm and dry, no rashes noted.  Cardiac: Regular rate and rhythm, no murmurs rubs or gallops.  Respiratory: Clear to auscultation bilaterally. Not using accessory muscles,  speaking in full sentences.  Abdominal: Soft, nontender, nondistended, positive bowel sounds, no masses, no organomegaly.  Musculoskeletal: Shoulder, elbow, wrist, hip, knee, ankle stable, and with full range of motion.     Assessment:     Healthy male     Plan:     During the course of the visit the patient was educated and counseled about appropriate screening and preventive services including:    Screening electrocardiogram  Prostate cancer screening  Colorectal cancer screening  Nutrition counseling   Diet review for nutrition referral? Yes ____  Not Indicated __x__   Patient Instructions (the written plan) was given to the patient.  Medicare Attestation I have personally reviewed: The patient's medical and social history Their use of alcohol, tobacco or illicit drugs Their current medications and supplements The patient's functional ability including ADLs,fall risks, home safety risks, cognitive, and hearing and visual impairment Diet and physical activities Evidence for depression or mood disorders  The patient's weight,  height, BMI, and visual acuity have been recorded in the chart.  I have made referrals, counseling, and provided education to the patient based on review of the above and I have provided the patient with a written personalized care plan for preventive services.     Rodney Langton, MD   12/18/2014

## 2014-12-18 NOTE — Assessment & Plan Note (Signed)
With several episodes of nocturia. Checking urinalysis, PSA, we have avoided rectal exam today as we are checking blood work.  I'm going to add Flomax as well.

## 2014-12-18 NOTE — Assessment & Plan Note (Signed)
Medicare physical performed today. Ordering AAA screening, he is up-to-date on colon cancer screening, due in 3 years.

## 2014-12-19 LAB — URINALYSIS
Bilirubin Urine: NEGATIVE
Glucose, UA: NEGATIVE mg/dL
Hgb urine dipstick: NEGATIVE
Ketones, ur: NEGATIVE mg/dL
Leukocytes, UA: NEGATIVE
Nitrite: NEGATIVE
Protein, ur: NEGATIVE mg/dL
Specific Gravity, Urine: 1.02 (ref 1.005–1.030)
Urobilinogen, UA: 0.2 mg/dL (ref 0.0–1.0)
pH: 7 (ref 5.0–8.0)

## 2014-12-19 LAB — PSA, TOTAL AND FREE
PSA, Free Pct: 25 % (ref >25)
PSA, Free: 0.49 ng/mL
PSA: 1.98 ng/mL (ref <=4.00)

## 2014-12-28 ENCOUNTER — Telehealth: Payer: Self-pay

## 2014-12-28 NOTE — Telephone Encounter (Signed)
[facility] Aorta. She stated because he is now on a PPO plan [facility] and we do not need to obtain a prior auth we can go ahead and schedule. - CF

## 2015-01-14 ENCOUNTER — Ambulatory Visit (HOSPITAL_BASED_OUTPATIENT_CLINIC_OR_DEPARTMENT_OTHER)
Admission: RE | Admit: 2015-01-14 | Discharge: 2015-01-14 | Disposition: A | Discharge status: Home / Self Care | Payer: Medicare PPO | Source: Ambulatory Visit | Attending: Sports Medicine | Admitting: Sports Medicine

## 2015-01-14 DIAGNOSIS — Z136 Encounter for screening for cardiovascular disorders: Secondary | ICD-10-CM | POA: Diagnosis not present

## 2015-01-16 ENCOUNTER — Emergency Department
Admission: EM | Admit: 2015-01-16 | Discharge: 2015-01-16 | Disposition: A | Discharge status: Home / Self Care | Payer: Medicare PPO | Source: Home / Self Care | Attending: Family Medicine | Admitting: Family Medicine

## 2015-01-16 ENCOUNTER — Emergency Department (INDEPENDENT_AMBULATORY_CARE_PROVIDER_SITE_OTHER): Payer: Medicare PPO

## 2015-01-16 DIAGNOSIS — M545 Low back pain, unspecified: Secondary | ICD-10-CM

## 2015-01-16 MED ORDER — KETOROLAC TROMETHAMINE 60 MG/2ML IM SOLN
60.0000 mg | Freq: Once | INTRAMUSCULAR | Status: AC
  Administered 2015-01-16: 60 mg via INTRAMUSCULAR

## 2015-01-16 MED ORDER — METAXALONE 800 MG PO TABS: 800.0000 mg | ORAL_TABLET | Freq: Three times a day (TID) | ORAL | Status: AC

## 2015-01-16 MED ORDER — MELOXICAM 7.5 MG PO TABS: ORAL_TABLET | ORAL | Status: AC

## 2015-01-16 MED ORDER — TRAMADOL HCL 50 MG PO TABS: ORAL_TABLET | ORAL | Status: AC

## 2015-01-16 NOTE — ED Provider Notes (Signed)
CSN: [id number]     Arrival date & time 01/16/15  1153 History   First MD Initiated Contact with Patient 01/16/15 1218     Chief Complaint  Patient presents with  . Back Pain    Lower back pain       HPI Comments: Patient states that two days ago he reached up to straighten a picture on the wall, and felt sudden sharp left lower back pain.  He had left knee surgery three weeks ago and has been taking left over pain medication. He had similar back pain in 2011.   He denies bowel or bladder dysfunction, and no saddle numbness. The pain does not radiate.  The pain is worse when arching his back, and better when standing and leaning forward.  He has obtained some relief by wearing a back brace  Patient is a 69 y.o. male presenting with back pain. The history is provided by the patient and a relative.  Back Pain Location:  Lumbar spine Quality:  Aching and stabbing Radiates to:  Does not radiate Pain severity:  Moderate Pain is:  Same all the time Onset quality:  Sudden Duration:  3 days Timing:  Constant Progression:  Unchanged Chronicity:  Recurrent Context comment:  Occurred while reaching upward with arm Relieved by: back brace. Exacerbated by: arching back. Ineffective treatments:  NSAIDs and narcotics Associated symptoms: no abdominal pain, no abdominal swelling, no bladder incontinence, no bowel incontinence, no chest pain, no dysuria, no fever, no leg pain, no numbness, no paresthesias, no pelvic pain, no perianal numbness, no tingling, no weakness and no weight loss   Risk factors: recent surgery     Past Medical History  Diagnosis Date  . DDD (degenerative disc disease), lumbar     MRI 03-2010 in   . CAD (coronary artery disease) 1994, 2002    angioplasty 1994, stented x 1 2002  . Hyperlipidemia   . Acute MI 1994  . DISH (diffuse idiopathic skeletal hyperostosis)    Past Surgical History  Procedure Laterality Date  . Hernia repair  1994    repeat 1995, 2001 groin    . Coronary angioplasty with stent placement  2002   Family History  Problem Relation Age of Onset  . Heart disease Father     MI at age 61  . Breast cancer Sister   . Rheum arthritis Mother    History  Substance Use Topics  . Smoking status: Former Smoker    Types: Cigarettes    Quit date: 10/23/1992  . Smokeless tobacco: Never Used  . Alcohol Use: 0.6 oz/week    1 Glasses of wine per week     Comment: Occasionally    Review of Systems  Constitutional: Negative for fever and weight loss.  Cardiovascular: Negative for chest pain.  Gastrointestinal: Negative for abdominal pain and bowel incontinence.  Genitourinary: Negative for bladder incontinence, dysuria and pelvic pain.  Musculoskeletal: Positive for back pain.  Neurological: Negative for tingling, weakness, numbness and paresthesias.  All other systems reviewed and are negative.   Allergies  Review of patient's allergies indicates no known allergies.  Home Medications   Prior to Admission medications   Medication Sig Start Date End Date Taking? Authorizing Provider  AMBULATORY NON FORMULARY MEDICATION Knee-high, medium compression, graduated compression stockings. Apply to lower extremities. 12/18/14   Monica Becton, MD  aspirin 81 MG tablet Take 81 mg by mouth daily.      Historical Provider, MD  Cholecalciferol (VITAMIN D-3) 1000  UNITS CAPS Take by mouth.    Historical Provider, MD  Coenzyme Q10 (COQ10) 100 MG CAPS Take by mouth.    Historical Provider, MD  diclofenac (FLECTOR) 1.3 % PTCH Place 1 patch onto the skin 2 (two) times daily. 02/10/14   Monica Bectonhomas J Thekkekandam, MD  ibuprofen (ADVIL,MOTRIN) 800 MG tablet Take 800 mg by mouth every 8 (eight) hours as needed.    Historical Provider, MD  KRILL OIL PO Take by mouth.    Historical Provider, MD  magnesium oxide (MAG-OX) 400 MG tablet Take 2 tablets (800 mg total) by mouth at bedtime. 12/18/14   Monica Bectonhomas J Thekkekandam, MD  meloxicam (MOBIC) 7.5 MG tablet  Take one tab with breakfast each morning 01/16/15   Lattie HawStephen A Beese, MD  metaxalone (SKELAXIN) 800 MG tablet Take 1 tablet (800 mg total) by mouth 3 (three) times daily. 01/16/15   Lattie HawStephen A Beese, MD  Multiple Vitamin (MULTIVITAMIN PO) Take by mouth.      Historical Provider, MD  rosuvastatin (CRESTOR) 40 MG tablet Take 1 tablet (40 mg total) by mouth daily. TAKE 1 TABLET DAILY 12/18/14   Monica Bectonhomas J Thekkekandam, MD  tamsulosin [facility](FLOMAX) 0.4 MG CAPS capsule Take 1 capsule (0.4 mg total) by mouth daily after breakfast. 12/18/14   Monica Bectonhomas J Thekkekandam, MD  traMADol Janean Sark(ULTRAM) 50 MG tablet Take one or two tabs at bedtime as needed for pain 01/16/15   Lattie HawStephen A Beese, MD   BP 146/85 mmHg  Pulse 63  Temp(Src) 98.2 F (36.8 C) (Oral)  Ht 5\' 9"  (1.753 m)  Wt 175 lb (79.379 kg)  BMI 25.83 kg/m2  SpO2 98% Physical Exam  Constitutional: He is oriented to person, place, and time. He appears well-developed and well-nourished. No distress.  Patient moves slowly and deliberately because of his back pain.  HENT:  Head: Normocephalic.  Mouth/Throat: Oropharynx is clear and moist.  Eyes: Conjunctivae are normal. Pupils are equal, round, and reactive to light.  Neck: Normal range of motion.  Cardiovascular: Normal heart sounds.   Pulmonary/Chest: Breath sounds normal.  Abdominal: Bowel sounds are normal. There is no tenderness.  Musculoskeletal: He exhibits no edema.       Lumbar back: He exhibits decreased range of motion and tenderness. He exhibits no bony tenderness, no swelling, no edema and no deformity.       Back:  Back:  Decreased range of motion.  Can squat without difficulty.  Tenderness in the midline and bilateral paraspinous muscles from L3 to Sacral area.  Straight leg raising test is negative.  Sitting knee extension test is negative.  Strength and sensation in the lower extremities is normal.  Patellar and achilles reflexes are normal   Neurological: He is alert and oriented to person, place,  and time.  Skin: Skin is warm and dry. No rash noted.  Nursing note and vitals reviewed.   ED Course  Procedures none  Imaging Review Dg Lumbar Spine Complete  01/16/2015   CLINICAL DATA:  Recurrent low back pain for 2 days  EXAM: LUMBAR SPINE - COMPLETE 4+ VIEW  COMPARISON:  None.  FINDINGS: There is no evidence of lumbar spine fracture. Alignment is normal. Intervertebral disc spaces are maintained. 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  IMPRESSION: Negative.   Electronically Signed   By: Christiana PellantGretchen  Green M.D.   On: 01/16/2015 13:15     MDM   1. Midline low back  pain without sciatica    Toradol  IM Rx for Mobic 7.5mg  and Skelaxin Apply ice pack for 20 to 30 minutes, 3 to 4 times daily  Continue until pain decreases.  Begin back exercises as tolerated.  May wear back brace for 3 to 5 more days. Followup with Dr. [facility]ney [facility] ([facility]) if not improving about two weeks.     Lattie Haw, MD 01/22/15 [phone]

## 2015-01-16 NOTE — ED Notes (Signed)
Patient complains of lower back pain for approx 3 days. Patient states that he "threw it out" and that it happens occasionally. He did take Advil today and he did have a few old RX for pain from a previous knee surgery that he tried.

## 2015-01-16 NOTE — Discharge Instructions (Signed)
Apply ice pack for 20 to 30 minutes, 3 to 4 times daily  Continue until pain decreases.  Begin back exercises as tolerated.  May wear back brace for 3 to 5 more days.   Back Pain, Adult Low back pain is very common. About 1 in 5 people have back pain.The cause of low back pain is rarely dangerous. The pain often gets better over time.About half of people with a sudden onset of back pain feel better in just 2 weeks. About 8 in 10 people feel better by 6 weeks.  CAUSES Some common causes of back pain include:  Strain of the muscles or ligaments supporting the spine.  Wear and tear (degeneration) of the spinal discs.  Arthritis.  Direct injury to the back. DIAGNOSIS Most of the time, the direct cause of low back pain is not known.However, back pain can be treated effectively even when the exact cause of the pain is unknown.Answering your caregiver's questions about your overall health and symptoms is one of the most accurate ways to make sure the cause of your pain is not dangerous. If your caregiver needs more information, he or she may order lab work or imaging tests (X-rays or MRIs).However, even if imaging tests show changes in your back, this usually does not require surgery. HOME CARE INSTRUCTIONS For many people, back pain returns.Since low back pain is rarely dangerous, it is often a condition that people can learn to [facility] LLCmanageon their own.   Remain active. It is stressful on the back to sit or stand in one place. Do not sit, drive, or stand in one place for more than 30 minutes at a time. Take short walks on level surfaces as soon as pain allows.Try to increase the length of time you walk each day.  Do not stay in bed.Resting more than 1 or 2 days can delay your recovery.  Do not avoid exercise or work.Your body is made to move.It is not dangerous to be active, even though your back may hurt.Your back will likely heal faster if you return to being active before your pain is  gone.  Pay attention to your body when you bend and lift. Many people have less discomfortwhen lifting if they bend their knees, keep the load close to their bodies,and avoid twisting. Often, the most comfortable positions are those that put less stress on your recovering back.  Find a comfortable position to sleep. Use a firm mattress and lie on your side with your knees slightly bent. If you lie on your back, put a pillow under your knees.  Only take over-the-counter or prescription medicines as directed by your caregiver. Over-the-counter medicines to reduce pain and inflammation are often the most helpful.Your caregiver may prescribe muscle relaxant drugs.These medicines help dull your pain so you can more quickly return to your normal activities and healthy exercise.  Put ice on the injured area.  Put ice in a plastic bag.  Place a towel between your skin and the bag.  Leave the ice on for 15-20 minutes, 03-04 times a day for the first 2 to 3 days. After that, ice and heat may be alternated to reduce pain and spasms.  Ask your caregiver about trying back exercises and gentle massage. This may be of some benefit.  Avoid feeling anxious or stressed.Stress increases muscle tension and can worsen back pain.It is important to recognize when you are anxious or stressed and learn ways to manage it.Exercise is a great option. SEEK MEDICAL CARE  IF:  You have pain that is not relieved with rest or medicine.  You have pain that does not improve in 1 week.  You have new symptoms.  You are generally not feeling well. SEEK IMMEDIATE MEDICAL CARE IF:   You have pain that radiates from your back into your legs.  You develop new bowel or bladder control problems.  You have unusual weakness or numbness in your arms or legs.  You develop nausea or vomiting.  You develop abdominal pain.  You feel faint. Document Released: 10/09/2005 Document Revised: 04/09/2012 Document Reviewed:  02/10/2014 [facility] Patient Information 2015 [place], [place]. This information is not intended to replace advice given to you by your health care provider. Make sure you discuss any questions you have with your health care provider.

## 2015-09-27 ENCOUNTER — Other Ambulatory Visit: Payer: Self-pay | Admitting: Sports Medicine

## 2015-09-27 DIAGNOSIS — M5136 Other intervertebral disc degeneration, lumbar region: Secondary | ICD-10-CM

## 2015-09-27 MED ORDER — MAGNESIUM OXIDE 400 MG PO TABS: 800.0000 mg | ORAL_TABLET | Freq: Every day | ORAL | Status: AC

## 2016-03-30 ENCOUNTER — Other Ambulatory Visit: Payer: Self-pay | Admitting: Sports Medicine

## 2016-10-03 IMAGING — CR DG LUMBAR SPINE COMPLETE 4+V
5 series · 5 of 5 positions shown · non-contrast
Comparison: None.

CLINICAL DATA: Recurrent low back pain for 2 days

EXAM:
LUMBAR SPINE - COMPLETE 4+ VIEW

[l-spine ap]
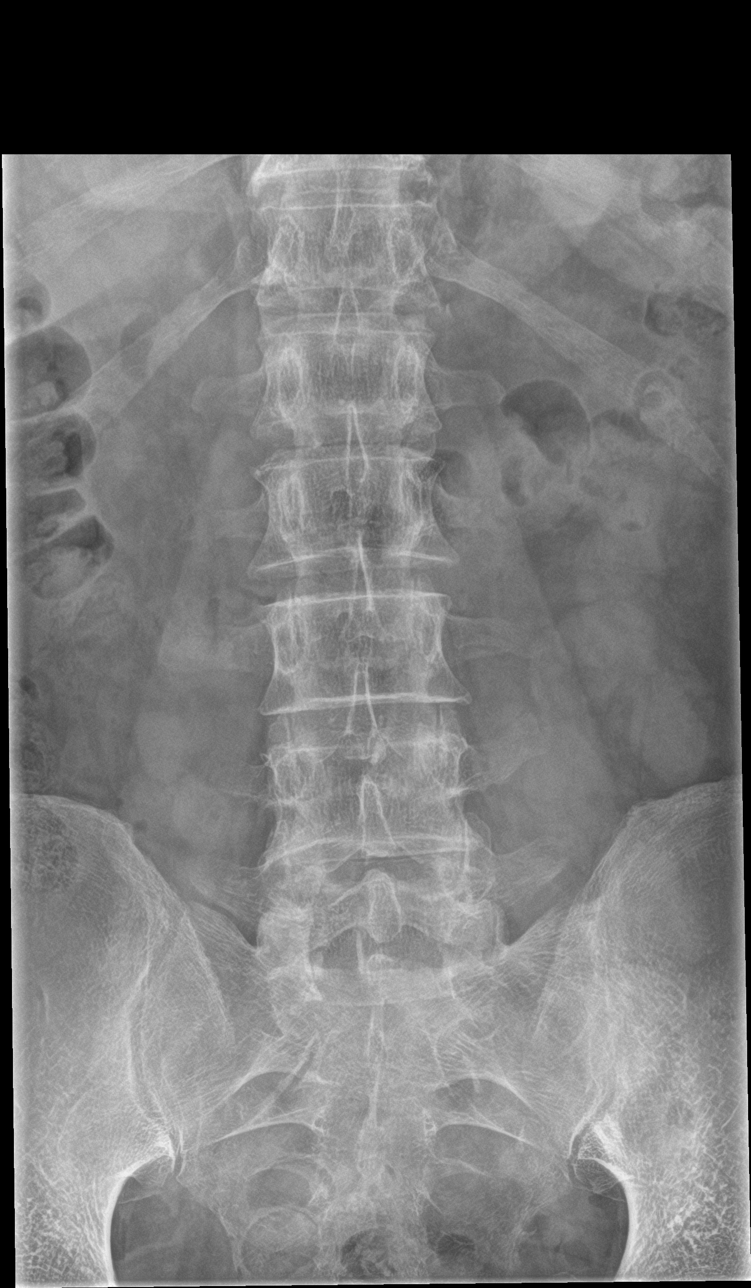

[l-spine obl (1 of 2)]
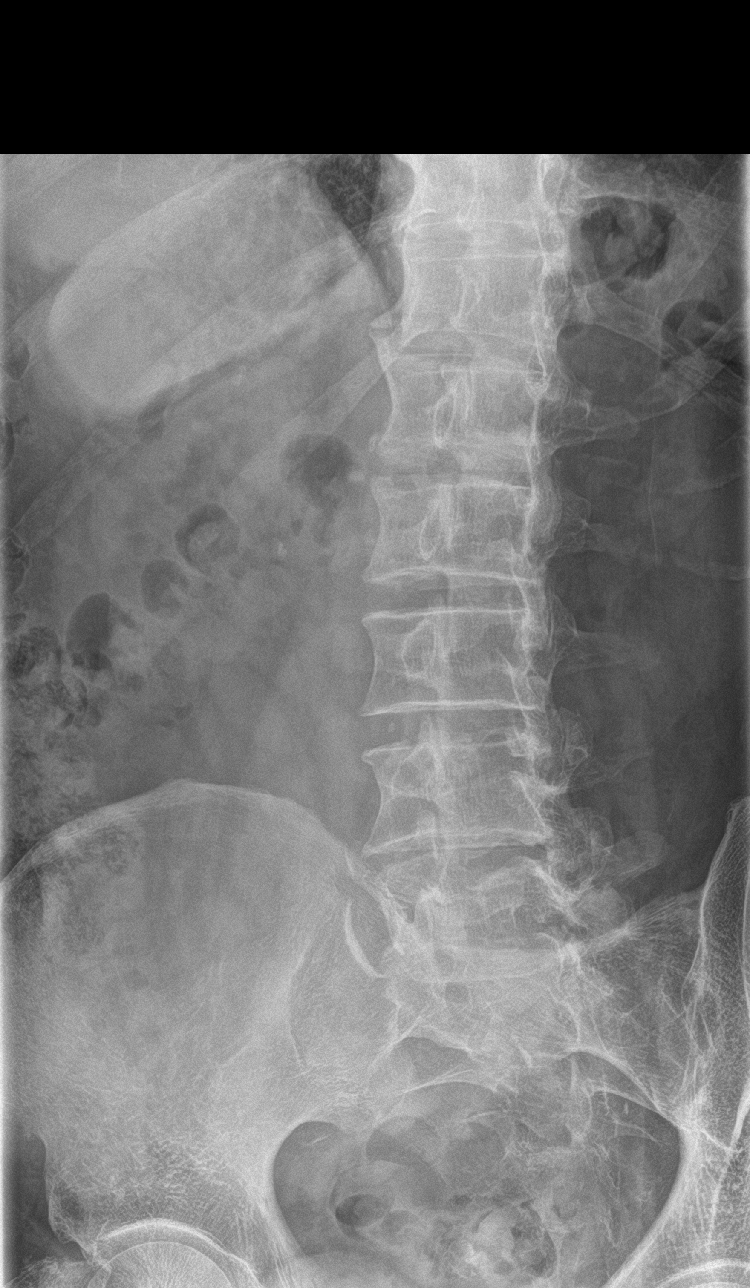

[l-spine obl (2 of 2)]
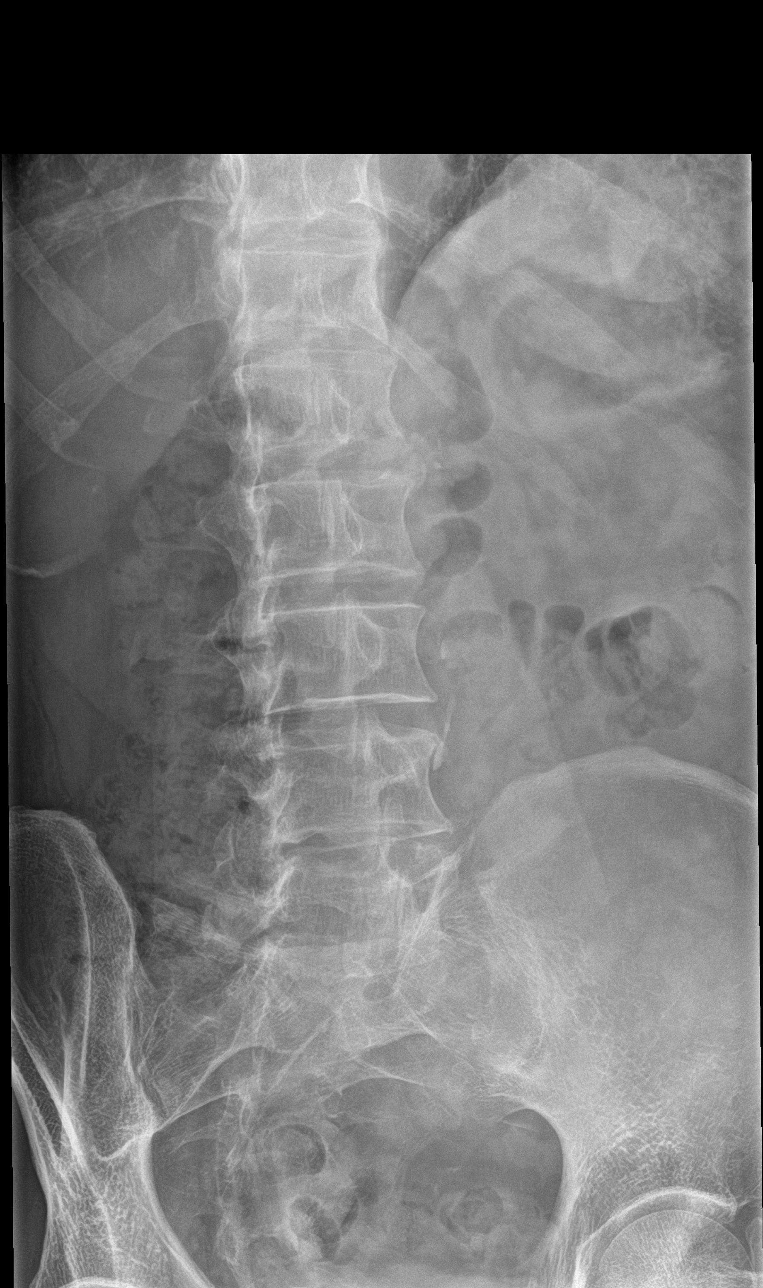

[l-spine lat]
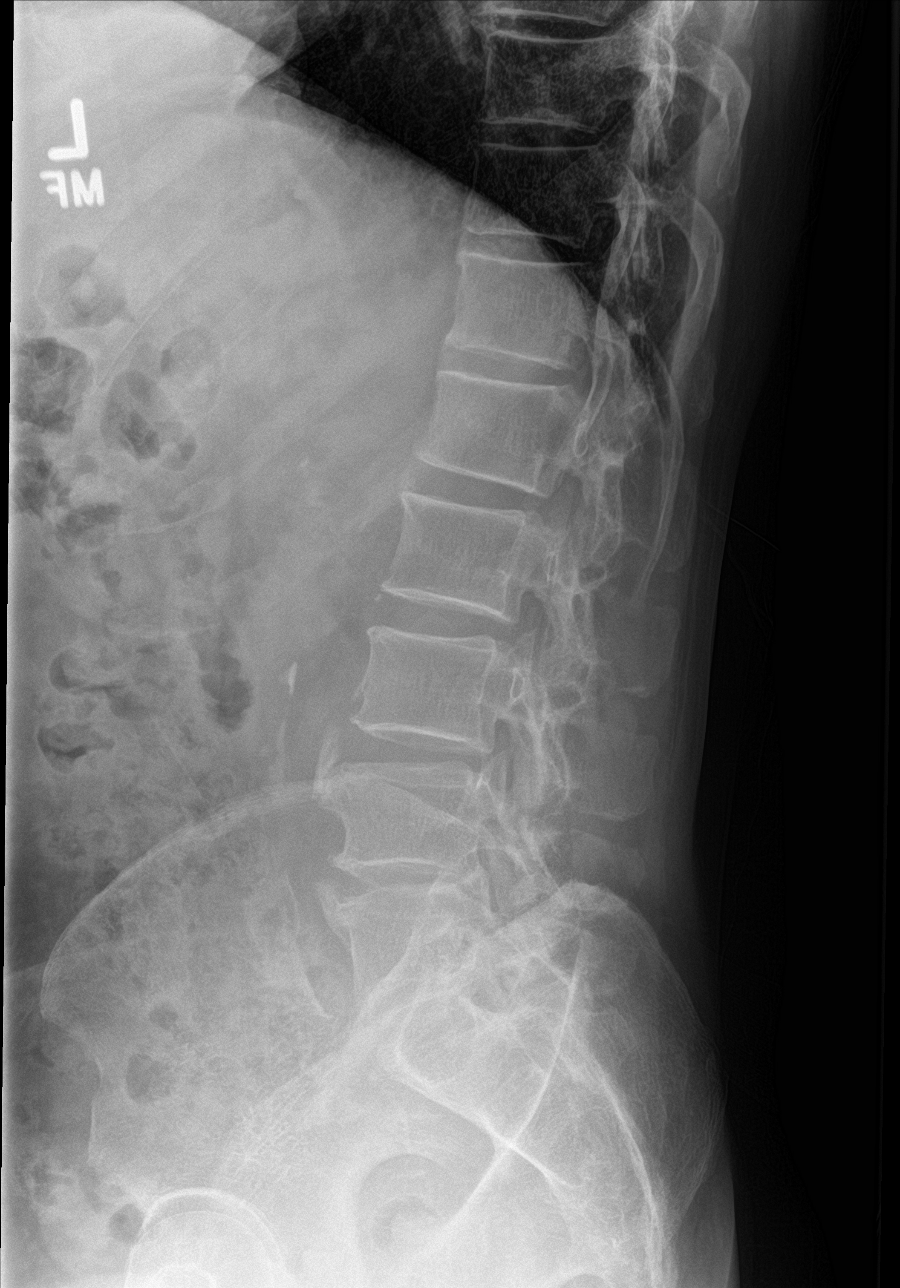

[l-spine spot]
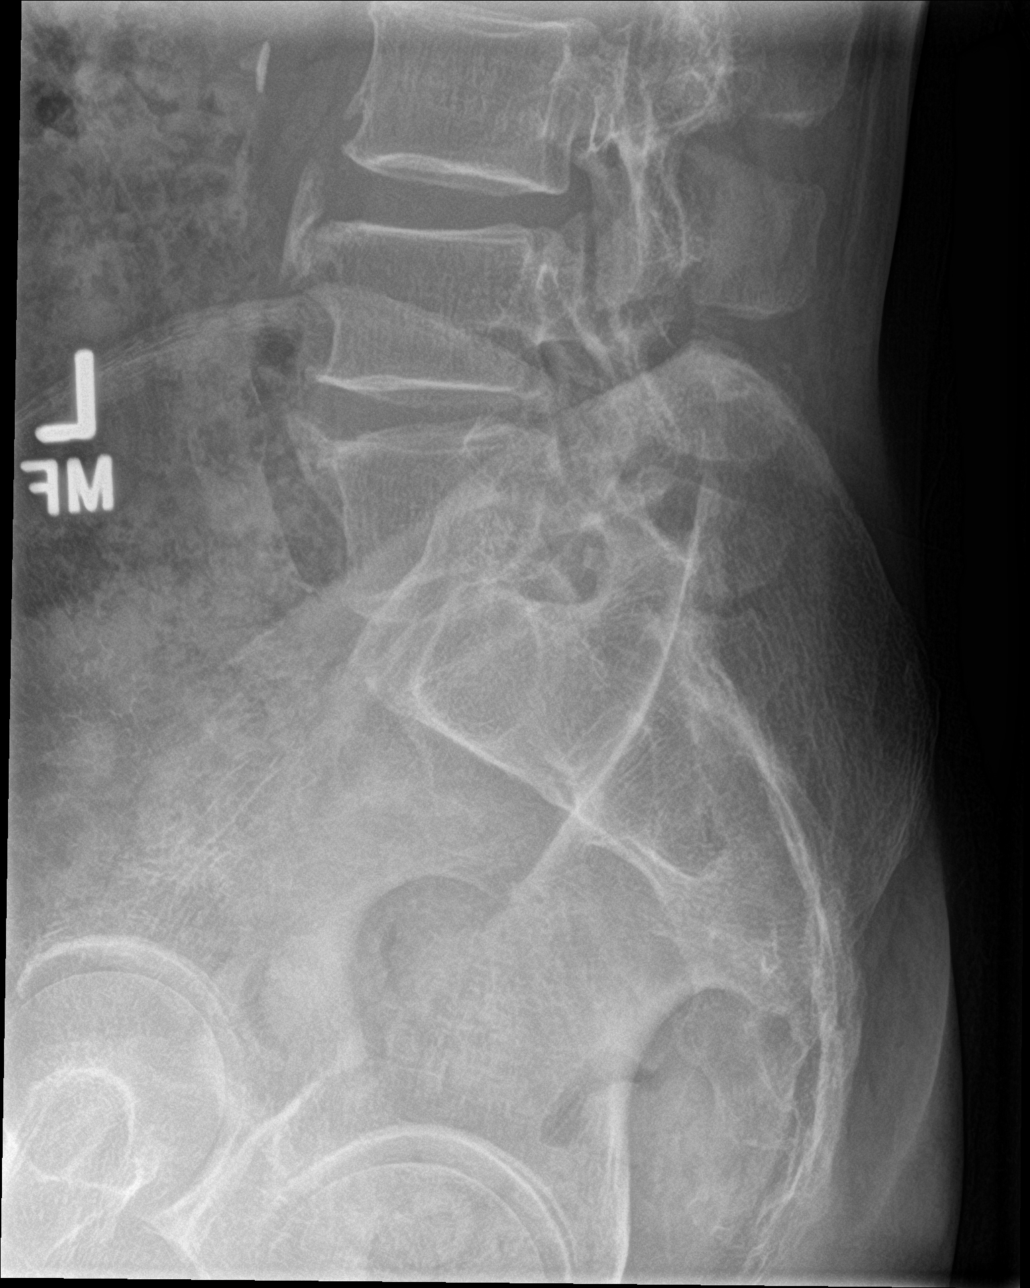

[5 of 5 positions shown; findings below may reference images not displayed]

FINDINGS: There is no evidence of lumbar spine fracture. Alignment is normal.
Intervertebral disc spaces are maintained. Atheromatous aortic
calcification noted with infrarenal distal aortic ectasia measuring
1.8 cm at the calcified portion anterior to the L5 level.
IMPRESSION: Negative.
